# Patient Record
Sex: Female | Born: 1950 | Race: White | Hispanic: No | Marital: Married | Smoking: Never smoker
Health system: Southern US, Community
[De-identification: ages and names within clinical notes are randomized; demographics above are authoritative.]

## PROBLEM LIST (undated history)

## (undated) DIAGNOSIS — K219 Gastro-esophageal reflux disease without esophagitis: Secondary | ICD-10-CM

## (undated) DIAGNOSIS — E669 Obesity, unspecified: Secondary | ICD-10-CM

## (undated) DIAGNOSIS — Z8639 Personal history of other endocrine, nutritional and metabolic disease: Secondary | ICD-10-CM

## (undated) DIAGNOSIS — R32 Unspecified urinary incontinence: Secondary | ICD-10-CM

## (undated) DIAGNOSIS — I10 Essential (primary) hypertension: Secondary | ICD-10-CM

## (undated) DIAGNOSIS — E119 Type 2 diabetes mellitus without complications: Secondary | ICD-10-CM

## (undated) DIAGNOSIS — E785 Hyperlipidemia, unspecified: Secondary | ICD-10-CM

## (undated) DIAGNOSIS — R42 Dizziness and giddiness: Secondary | ICD-10-CM

## (undated) DIAGNOSIS — E039 Hypothyroidism, unspecified: Secondary | ICD-10-CM

## (undated) DIAGNOSIS — L918 Other hypertrophic disorders of the skin: Secondary | ICD-10-CM

## (undated) HISTORY — DX: Essential (primary) hypertension: I10

## (undated) HISTORY — DX: Hyperlipidemia, unspecified: E78.5

## (undated) HISTORY — DX: Obesity, unspecified: E66.9

## (undated) HISTORY — DX: Personal history of other endocrine, nutritional and metabolic disease: Z86.39

## (undated) HISTORY — DX: Unspecified urinary incontinence: R32

## (undated) HISTORY — DX: Hypothyroidism, unspecified: E03.9

## (undated) HISTORY — DX: Dizziness and giddiness: R42

## (undated) HISTORY — DX: Type 2 diabetes mellitus without complications: E11.9

## (undated) HISTORY — DX: Other hypertrophic disorders of the skin: L91.8

## (undated) HISTORY — DX: Gastro-esophageal reflux disease without esophagitis: K21.9

---
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
## 1993-10-21 HISTORY — PX: CHOLECYSTECTOMY: SHX55

## 2011-09-17 DIAGNOSIS — S5290XA Unspecified fracture of unspecified forearm, initial encounter for closed fracture: Secondary | ICD-10-CM | POA: Insufficient documentation

## 2015-04-06 DIAGNOSIS — K760 Fatty (change of) liver, not elsewhere classified: Secondary | ICD-10-CM | POA: Insufficient documentation

## 2016-05-27 DIAGNOSIS — G510 Bell's palsy: Secondary | ICD-10-CM | POA: Insufficient documentation

## 2017-12-06 DIAGNOSIS — E039 Hypothyroidism, unspecified: Secondary | ICD-10-CM | POA: Insufficient documentation

## 2017-12-06 DIAGNOSIS — E66811 Obesity, class 1: Secondary | ICD-10-CM | POA: Insufficient documentation

## 2017-12-06 DIAGNOSIS — Z6835 Body mass index (BMI) 35.0-35.9, adult: Secondary | ICD-10-CM | POA: Insufficient documentation

## 2017-12-06 DIAGNOSIS — I10 Essential (primary) hypertension: Secondary | ICD-10-CM | POA: Insufficient documentation

## 2018-01-01 DIAGNOSIS — M542 Cervicalgia: Secondary | ICD-10-CM | POA: Insufficient documentation

## 2018-01-01 DIAGNOSIS — R42 Dizziness and giddiness: Secondary | ICD-10-CM | POA: Insufficient documentation

## 2019-04-07 ENCOUNTER — Other Ambulatory Visit: Payer: Self-pay | Admitting: Internal Medicine

## 2019-04-07 ENCOUNTER — Ambulatory Visit
Admission: RE | Admit: 2019-04-07 | Discharge: 2019-04-07 | Disposition: A | Discharge status: Home / Self Care | Payer: Medicare Other | Source: Ambulatory Visit | Attending: Internal Medicine | Admitting: Internal Medicine

## 2019-04-07 DIAGNOSIS — M25522 Pain in left elbow: Secondary | ICD-10-CM

## 2019-10-27 ENCOUNTER — Encounter: Payer: Self-pay | Admitting: Pulmonary Disease

## 2019-10-27 ENCOUNTER — Ambulatory Visit (INDEPENDENT_AMBULATORY_CARE_PROVIDER_SITE_OTHER): Payer: Medicare Other | Admitting: Pulmonary Disease

## 2019-10-27 ENCOUNTER — Other Ambulatory Visit: Payer: Self-pay

## 2019-10-27 VITALS — BP 126/64 | HR 68 | Ht 62.0 in | Wt 174.8 lb

## 2019-10-27 DIAGNOSIS — G4733 Obstructive sleep apnea (adult) (pediatric): Secondary | ICD-10-CM

## 2019-10-27 NOTE — Patient Instructions (Addendum)
Obstructive sleep apnea  We will schedule you for a home sleep study  Set you up with CPAP-similar pressures with what you are on already  We will see you back in about 6 to 8 weeks  Call with significant concerns

## 2019-10-27 NOTE — Progress Notes (Signed)
Subjective:    Patient ID: Lori Mcdonald, female    DOB: 1951/10/17, 69 y.o.   MRN: [id number]  Patient with a history of obstructive sleep apnea  Current machine is stated  Has used CPAP for 17 years Usually goes to bed between 1 and 2 AM Does not wake up in the middle of the night Final awakening time between 830 and 9 AM continues to benefit from CPAP use  Denies any significant dryness of the mouth in the mornings No morning headaches Memory is good No choking or abnormal respirations at night  Continues to benefit from CPAP use   History reviewed. No pertinent past medical history.  Social History   Socioeconomic History  . Marital status: Married    Spouse name: Not on file  . Number of children: Not on file  . Years of education: Not on file  . Highest education level: Not on file  Occupational History  . Not on file  Tobacco Use  . Smoking status: Never Smoker  . Smokeless tobacco: Never Used  Substance and Sexual Activity  . Alcohol use: Never  . Drug use: Never  . Sexual activity: Not on file  Other Topics Concern  . Not on file  Social History Narrative  . Not on file   Social Determinants of Health   Financial Resource Strain:   . Difficulty of Paying Living Expenses: Not on file  Food Insecurity:   . Worried About Programme researcher, broadcasting/film/video in the Last Year: Not on file  . Ran Out of Food in the Last Year: Not on file  Transportation Needs:   . Lack of Transportation (Medical): Not on file  . Lack of Transportation (Non-Medical): Not on file  Physical Activity:   . Days of Exercise per Week: Not on file  . Minutes of Exercise per Session: Not on file  Stress:   . Feeling of Stress : Not on file  Social Connections:   . Frequency of Communication with Friends and Family: Not on file  . Frequency of Social Gatherings with Friends and Family: Not on file  . Attends Religious Services: Not on file  . Active Member of Clubs or Organizations: Not on  file  . Attends Banker Meetings: Not on file  . Marital Status: Not on file  Intimate Partner Violence:   . Fear of Current or Ex-Partner: Not on file  . Emotionally Abused: Not on file  . Physically Abused: Not on file  . Sexually Abused: Not on file   History reviewed. No pertinent family history.  Review of Systems  Constitutional: Negative for fever and unexpected weight change.  HENT: Negative for congestion, dental problem, ear pain, nosebleeds, postnasal drip, rhinorrhea, sinus pressure, sneezing, sore throat and trouble swallowing.   Eyes: Negative for redness and itching.  Respiratory: Negative for cough, chest tightness, shortness of breath and wheezing.   Cardiovascular: Negative for palpitations and leg swelling.  Gastrointestinal: Negative for nausea and vomiting.  Genitourinary: Negative for dysuria.  Musculoskeletal: Negative for joint swelling.  Skin: Negative for rash.  Allergic/Immunologic: Positive for environmental allergies. Negative for food allergies and immunocompromised state.  Neurological: Negative for headaches.  Hematological: Does not bruise/bleed easily.  Psychiatric/Behavioral: Negative for dysphoric mood. The patient is not nervous/anxious.   All other systems reviewed and are negative.     Objective:   Physical Exam Constitutional:      Appearance: She is obese.  HENT:     Head:  Normocephalic and atraumatic.     Nose: Nose normal. No congestion.     Mouth/Throat:     Mouth: Mucous membranes are moist.  Eyes:     Pupils: Pupils are equal, round, and reactive to light.  Cardiovascular:     Rate and Rhythm: Normal rate and regular rhythm.     Pulses: Normal pulses.     Heart sounds: Normal heart sounds. No murmur. No friction rub.  Pulmonary:     Effort: Pulmonary effort is normal. No respiratory distress.     Breath sounds: Normal breath sounds. No stridor. No wheezing or rhonchi.  Musculoskeletal:        General: No  swelling. Normal range of motion.     Cervical back: Normal range of motion and neck supple. No rigidity.  Skin:    General: Skin is warm and dry.     Coloration: Skin is not jaundiced or pale.     Findings: No bruising or erythema.  Neurological:     General: No focal deficit present.     Mental Status: She is alert.  Psychiatric:        Mood and Affect: Mood normal.    Vitals:   10/27/19 1425  BP: 126/64  Pulse: 68  SpO2: 97%   Results of the Epworth flowsheet 10/27/2019  Sitting and reading 0  Watching TV 0  Sitting, inactive in a public place (e.g. a theatre or a meeting) 0  As a passenger in a car for an hour without a break 0  Lying down to rest in the afternoon when circumstances permit 2  Sitting and talking to someone 0  Sitting quietly after a lunch without alcohol 0  In a car, while stopped for a few minutes in traffic 0  Total score 2   Compliance reveals 100% compliance Average pressure 7.8 Peak pressure of 8.6 Auto CPAP 4-20 Residual AHI 1.5     Assessment & Plan:  .  Obstructive sleep apnea .  Adequately treated with CPAP therapy .  Machine is dated  .  Continues to benefit from CPAP use  Pathophysiology of sleep disordered breathing discussed Options of treatment discussed  Plan: We will set the patient up for home sleep study We will initiate auto CPAP 4-20 following confirmation of diagnosis  I will see her back in the office in 6 to 8 weeks following which will follow yearly

## 2019-11-10 ENCOUNTER — Telehealth: Payer: Self-pay | Admitting: Pulmonary Disease

## 2019-11-18 ENCOUNTER — Ambulatory Visit: Payer: PRIVATE HEALTH INSURANCE

## 2019-11-26 ENCOUNTER — Ambulatory Visit: Payer: Medicare Other | Attending: Internal Medicine

## 2019-11-26 DIAGNOSIS — Z23 Encounter for immunization: Secondary | ICD-10-CM | POA: Insufficient documentation

## 2019-11-26 NOTE — Progress Notes (Signed)
   Covid-19 Vaccination Clinic  Name:  Lori Mcdonald    MRN: [id number] DOB: 09/19/1951  11/26/2019  Lori Mcdonald was observed post Covid-19 immunization for 15 minutes without incidence. She was provided with Vaccine Information Sheet and instruction to access the V-Safe system.   Lori Mcdonald was instructed to call 911 with any severe reactions post vaccine: Marland Kitchen Difficulty breathing  . Swelling of your face and throat  . A fast heartbeat  . A bad rash all over your body  . Dizziness and weakness    Immunizations Administered    Name Date Dose VIS Date Route   Pfizer COVID-19 Vaccine 11/26/2019  3:31 PM 0.3 mL 10/01/2019 Intramuscular   Manufacturer: [employer], [employer]   Lot: [id number]   NDC: [address]-1

## 2019-12-13 ENCOUNTER — Ambulatory Visit (INDEPENDENT_AMBULATORY_CARE_PROVIDER_SITE_OTHER): Payer: Medicare Other

## 2019-12-13 ENCOUNTER — Other Ambulatory Visit: Payer: Self-pay

## 2019-12-13 DIAGNOSIS — G4733 Obstructive sleep apnea (adult) (pediatric): Secondary | ICD-10-CM

## 2019-12-14 DIAGNOSIS — G4733 Obstructive sleep apnea (adult) (pediatric): Secondary | ICD-10-CM | POA: Diagnosis not present

## 2019-12-15 DIAGNOSIS — G4733 Obstructive sleep apnea (adult) (pediatric): Secondary | ICD-10-CM

## 2019-12-16 ENCOUNTER — Telehealth: Payer: Self-pay | Admitting: Pulmonary Disease

## 2019-12-16 NOTE — Telephone Encounter (Signed)
Called and spoke with Patient. Dr. Trena Platt results and recommendations given.  Patient stated she currently uses a cpap and is needing a new cpap.  Patient stated she can not sleep without a cpap, and after her sleep study, she had a headache that medications did not help. Patient stated she has to have a new cpap.   Dr. Wynona Neat has reviewed the home sleep test this test was negative for sleep apnea. No significant sleep disordered breathing. No significant desaturations.   Dr . Wynona Neat recommendation are encourage regular exercise, and weight loss. Caution against driving while sleepy and against medications with sedative effects. Please note a negative sleep study does not rule out diagnosis for osa.  If the pretest probability is high, up to 25-30%, may have a false negative study.   Message routed to Dr. Wynona Neat

## 2019-12-21 ENCOUNTER — Ambulatory Visit: Payer: Medicare Other | Attending: Internal Medicine

## 2019-12-21 DIAGNOSIS — Z23 Encounter for immunization: Secondary | ICD-10-CM

## 2019-12-21 NOTE — Progress Notes (Signed)
   Covid-19 Vaccination Clinic  Name:  Macall Mccroskey    MRN: [id number] DOB: 22-Jun-1951  12/21/2019  Ms. Goren was observed post Covid-19 immunization for 15 minutes without incident. She was provided with Vaccine Information Sheet and instruction to access the V-Safe system.   Ms. Stellmach was instructed to call 911 with any severe reactions post vaccine: Marland Kitchen Difficulty breathing  . Swelling of face and throat  . A fast heartbeat  . A bad rash all over body  . Dizziness and weakness   Immunizations Administered    Name Date Dose VIS Date Route   Pfizer COVID-19 Vaccine 12/21/2019  2:14 PM 0.3 mL 10/01/2019 Intramuscular   Manufacturer: [employer], [employer]   Lot: [id number]   NDC: [address]-8

## 2019-12-22 ENCOUNTER — Telehealth: Payer: Self-pay | Admitting: Pulmonary Disease

## 2019-12-22 NOTE — Telephone Encounter (Signed)
ATC Patient.  LM to call back. 

## 2019-12-22 NOTE — Telephone Encounter (Signed)
We can schedule an in lab study for the patient if she is agreeable.  Unfortunately, we cannot treat her based on a previous history or symptomatology. We have to have a positive study showing she still has obstructive sleep apnea.  Home sleep studies can underestimate the problem in some cases, there is a possibility that her sleep apnea is also better.  I will be glad to see her in the office if she is not agreeable to an in lab study. She also needs to understand that if an in lab study is negative, we cannot prescribe CPAP.

## 2019-12-22 NOTE — Telephone Encounter (Signed)
Called and spoke with pt to see if she was calling to get an appt scheduled to further discuss sleep study and pt stated that she did call and an appt was scheduled. nothing further needed.

## 2019-12-27 ENCOUNTER — Ambulatory Visit (INDEPENDENT_AMBULATORY_CARE_PROVIDER_SITE_OTHER): Payer: Medicare Other | Admitting: Pulmonary Disease

## 2019-12-27 ENCOUNTER — Encounter: Payer: Self-pay | Admitting: Pulmonary Disease

## 2019-12-27 ENCOUNTER — Other Ambulatory Visit: Payer: Self-pay

## 2019-12-27 DIAGNOSIS — G4733 Obstructive sleep apnea (adult) (pediatric): Secondary | ICD-10-CM | POA: Diagnosis not present

## 2019-12-27 DIAGNOSIS — Z9989 Dependence on other enabling machines and devices: Secondary | ICD-10-CM | POA: Diagnosis not present

## 2019-12-27 NOTE — Telephone Encounter (Signed)
Patient has Tele visit at 1200 12/27/19.

## 2019-12-27 NOTE — Progress Notes (Signed)
Virtual Visit via Telephone Note  I connected with Latanya Presser on 12/27/19 at 12:00 PM EST by telephone and verified that I am speaking with the correct person using two identifiers.  Location: Patient: Kimberly[facility] Dil[facility] Provider: Stephannie Peters discussed the limitations, risks, security and privacy concerns of performing an evaluation and management service by telephone and the availability of in person appointments. I also discussed with the patient that there may be a patient responsible charge related to this service. The patient expressed understanding and agreed to proceed.   History of Present Illness: Patient with a history of obstructive sleep apnea Recently had a home sleep study performed which was negative for significant sleep disordered breathing  Patient has significant symptoms at night and also significant daytime sleepiness Not able to sleep without CPAP  Snores heavily Multiple witnessed apneas by spouse  She does not think she will be able to sleep in the lab, did not have good experience on the previous sleep study   Observations/Objective: Frustrated about having to repeat study   Assessment and Plan: Obstructive sleep apnea Negative recent home sleep study  Plan will be to repeat sleep study in a couple of months She wants to repeat a home sleep study after giving it some time  She is aware that a negative study means we cannot treat her for sleep disordered breathing despite her symptoms A repeat negative home sleep study will indicate that she has to go into the lab to have a study done  Follow Up Instructions:    I discussed the assessment and treatment plan with the patient. The patient was provided an opportunity to ask questions and all were answered. The patient agreed with the plan and demonstrated an understanding of the instructions.   The patient was advised to call back or seek an in-person evaluation if the symptoms worsen or if the condition  fails to improve as anticipated.  I provided 20 minutes of non-face-to-face time during this encounter.   Tomma Lightning, MD

## 2020-01-03 ENCOUNTER — Encounter: Payer: Self-pay | Admitting: Family

## 2020-01-03 ENCOUNTER — Ambulatory Visit (INDEPENDENT_AMBULATORY_CARE_PROVIDER_SITE_OTHER): Payer: Medicare Other | Admitting: Family

## 2020-01-03 ENCOUNTER — Other Ambulatory Visit: Payer: Self-pay

## 2020-01-03 VITALS — BP 124/70 | HR 72 | Temp 98.0°F | Ht 62.0 in | Wt 174.0 lb

## 2020-01-03 DIAGNOSIS — E119 Type 2 diabetes mellitus without complications: Secondary | ICD-10-CM

## 2020-01-03 DIAGNOSIS — E785 Hyperlipidemia, unspecified: Secondary | ICD-10-CM | POA: Insufficient documentation

## 2020-01-03 DIAGNOSIS — E039 Hypothyroidism, unspecified: Secondary | ICD-10-CM | POA: Diagnosis not present

## 2020-01-03 DIAGNOSIS — E559 Vitamin D deficiency, unspecified: Secondary | ICD-10-CM | POA: Diagnosis not present

## 2020-01-03 DIAGNOSIS — Z1321 Encounter for screening for nutritional disorder: Secondary | ICD-10-CM | POA: Diagnosis not present

## 2020-01-03 DIAGNOSIS — E782 Mixed hyperlipidemia: Secondary | ICD-10-CM

## 2020-01-03 DIAGNOSIS — Z862 Personal history of diseases of the blood and blood-forming organs and certain disorders involving the immune mechanism: Secondary | ICD-10-CM

## 2020-01-03 DIAGNOSIS — I10 Essential (primary) hypertension: Secondary | ICD-10-CM

## 2020-01-03 LAB — COMPREHENSIVE METABOLIC PANEL
ALT: 13 U/L (ref 0–35)
AST: 11 U/L (ref 0–37)
Albumin: 4.1 g/dL (ref 3.5–5.2)
Alkaline Phosphatase: 58 U/L (ref 39–117)
BUN: 18 mg/dL (ref 6–23)
CO2: 28 mEq/L (ref 19–32)
Calcium: 9.4 mg/dL (ref 8.4–10.5)
Chloride: 103 mEq/L (ref 96–112)
Creatinine, Ser: 0.64 mg/dL (ref 0.40–1.20)
GFR: 92.05 mL/min (ref >60.00)
Glucose, Bld: 97 mg/dL (ref 70–99)
Potassium: 4.3 mEq/L (ref 3.5–5.1)
Sodium: 141 mEq/L (ref 135–145)
Total Bilirubin: 0.3 mg/dL (ref 0.2–1.2)
Total Protein: 7 g/dL (ref 6.0–8.3)

## 2020-01-03 LAB — LDL CHOLESTEROL, DIRECT: Direct LDL: 89 mg/dL

## 2020-01-03 LAB — CBC WITH DIFFERENTIAL/PLATELET
Basophils Absolute: 0.1 10*3/uL (ref 0.0–0.1)
Basophils Relative: 0.6 % (ref 0.0–3.0)
Eosinophils Absolute: 0.2 10*3/uL (ref 0.0–0.7)
Eosinophils Relative: 2.5 % (ref 0.0–5.0)
HCT: 36.9 % (ref 36.0–46.0)
Hemoglobin: 12.2 g/dL (ref 12.0–15.0)
Lymphocytes Relative: 20.2 % (ref 12.0–46.0)
Lymphs Abs: 2 10*3/uL (ref 0.7–4.0)
MCHC: 33.2 g/dL (ref 30.0–36.0)
MCV: 82.8 fl (ref 78.0–100.0)
Monocytes Absolute: 0.5 10*3/uL (ref 0.1–1.0)
Monocytes Relative: 5.5 % (ref 3.0–12.0)
Neutro Abs: 7 10*3/uL (ref 1.4–7.7)
Neutrophils Relative %: 71.2 % (ref 43.0–77.0)
Platelets: 302 10*3/uL (ref 150.0–400.0)
RBC: 4.45 Mil/uL (ref 3.87–5.11)
RDW: 13.8 % (ref 11.5–15.5)
WBC: 9.9 10*3/uL (ref 4.0–10.5)

## 2020-01-03 LAB — LIPID PANEL
Cholesterol: 163 mg/dL (ref 0–200)
HDL: 33.2 mg/dL — ABNORMAL LOW (ref >39.00)
NonHDL: 130.03
Total CHOL/HDL Ratio: 5
Triglycerides: 349 mg/dL — ABNORMAL HIGH (ref 0.0–149.0)
VLDL: 69.8 mg/dL — ABNORMAL HIGH (ref 0.0–40.0)

## 2020-01-03 LAB — TSH: TSH: 2.5 u[IU]/mL (ref 0.35–4.50)

## 2020-01-03 LAB — HEMOGLOBIN A1C: Hgb A1c MFr Bld: 5.9 % (ref 4.6–6.5)

## 2020-01-03 LAB — VITAMIN D 25 HYDROXY (VIT D DEFICIENCY, FRACTURES): VITD: 68.41 ng/mL (ref 30.00–100.00)

## 2020-01-03 MED ORDER — AMLODIPINE BESYLATE 2.5 MG PO TABS: 2.5000 mg | ORAL_TABLET | Freq: Every day | ORAL | 3 refills | Status: DC

## 2020-01-03 NOTE — Progress Notes (Signed)
Lori Mcdonald is a 69 y.o. female with the following history as recorded in Epic[facility]:  Patient Active Problem List   Diagnosis Date Noted  . Hyperlipidemia 01/03/2020  . Type 2 diabetes mellitus without complication, without long-term current use of insulin ([place]) 01/03/2020  . Essential hypertension 12/06/2017    Current Outpatient Medications  Medication Sig Dispense Refill  . acetaminophen (TYLENOL) 500 MG tablet Take by mouth.    Marland Kitchen amLODipine (NORVASC) 2.5 MG tablet Take 1 tablet (2.5 mg total) by mouth daily. 90 tablet 3  . ibuprofen (ADVIL) 400 MG tablet Take by mouth.    . metFORMIN (GLUCOPHAGE) 500 MG tablet Take 2,000 mg by mouth daily with breakfast.     . Multiple Vitamin (THERA) TABS Take by mouth.    . rosuvastatin (CRESTOR) 5 MG tablet Take 5 mg by mouth daily.     No current facility-administered medications for this visit.    Allergies: Ciprofloxacin and Demerol [meperidine]  History reviewed. No pertinent past medical history.  Past Surgical History:  Procedure Laterality Date  . CHOLECYSTECTOMY  1995  . TONSILLECTOMY  1957    History reviewed. No pertinent family history.  Social History   Tobacco Use  . Smoking status: Never Smoker  . Smokeless tobacco: Never Used  Substance Use Topics  . Alcohol use: Never    Subjective:  Patient presents today as a new patient;  1) History of hypertension; 2) History of hyperlipidemia; on low dose of Crestor 5 mg daily; 3) History of Type 2 Diabetes- last Hgba1c was checked almost a year ago due to COVID restrictions; thinks it was around 5.9? 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  Head: Normocephalic and atraumatic  Eyes: Sclera and conjunctiva clear; pupils round and reactive to light; extraocular movements intact  Ears: External normal; canals clear; tympanic membranes normal  Oropharynx: Pink, supple. No suspicious lesions  Neck: Supple without thyromegaly, adenopathy  Lungs: Respirations unlabored; clear to auscultation bilaterally without wheeze, rales, rhonchi  CVS exam: normal rate, regular rhythm, normal S1, S2, no murmurs, rubs, clicks or gallops.  Neurologic: Alert and oriented; speech intact; face symmetrical; moves all extremities well; CNII-XII intact without focal deficit   Assessment:  1. Type 2 diabetes mellitus without complication, without long-term current use of insulin ([place])   2. Hypothyroidism, unspecified type   3. Encounter for vitamin deficiency screening   4. Vitamin D deficiency   5. History of iron deficiency anemia   6. Essential hypertension   7. Mixed hyperlipidemia     Plan:  1. Update labs today; will adjust medication accordingly; 2. Sound like she was treated for subclinical hypothyroidism in the past- not currently taking medication; check TSh today; 4. Check Vitamin D level; 5. Check ferritin, iron, TIBC; 6. Stable; refill updated; 7. Check lipid panel- will decide if need to increase dosage of Crestor;  Follow-up to be determined;    No  follow-ups on file.  Orders Placed This Encounter  Procedures  . CBC with Differential/Platelet  . Comp Met (CMET)  . Lipid panel  . HgB A1c  . TSH  . Vitamin D (25 hydroxy)  . Iron, TIBC and Ferritin Panel    Requested Prescriptions   Signed Prescriptions Disp Refills  . amLODipine (NORVASC) 2.5 MG tablet 90 tablet 3    Sig: Take 1 tablet (2.5 mg total) by mouth daily.

## 2020-01-04 ENCOUNTER — Other Ambulatory Visit: Payer: Self-pay | Admitting: Family

## 2020-01-04 LAB — IRON,TIBC AND FERRITIN PANEL
%SAT: 13 % (calc) — ABNORMAL LOW (ref 16–45)
Ferritin: 55 ng/mL (ref 16–288)
Iron: 43 ug/dL — ABNORMAL LOW (ref 45–160)
TIBC: 335 mcg/dL (calc) (ref 250–450)

## 2020-01-04 MED ORDER — METFORMIN HCL 500 MG PO TABS: 2000.0000 mg | ORAL_TABLET | Freq: Every day | ORAL | 2 refills | Status: DC

## 2020-01-04 MED ORDER — ROSUVASTATIN CALCIUM 5 MG PO TABS: 5.0000 mg | ORAL_TABLET | Freq: Every day | ORAL | 1 refills | Status: DC

## 2020-02-16 IMAGING — DX LEFT ELBOW - 2 VIEW
2 series · 7 of 7 positions shown · non-contrast
Comparison: None.

CLINICAL DATA: Upper arm and posterior elbow pain since falling 5
days ago.

EXAM:
LEFT ELBOW - 2 VIEW

[Series 3: dg elbow 2 views left · left · 0.14mm/px · 3 of 3 slices shown (1 of 2)]
[im 1/3]
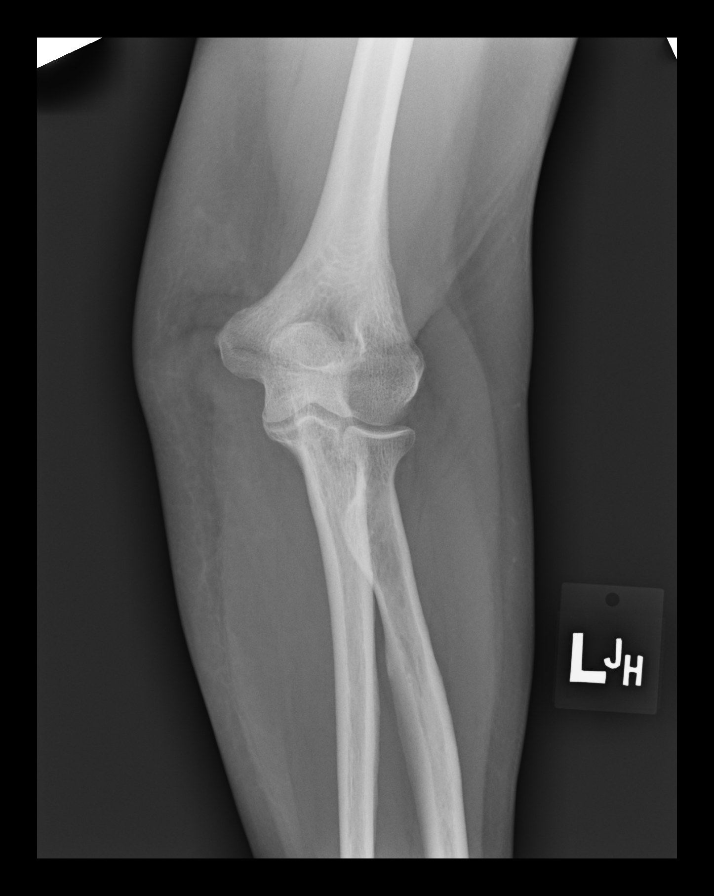
[im 2/3]
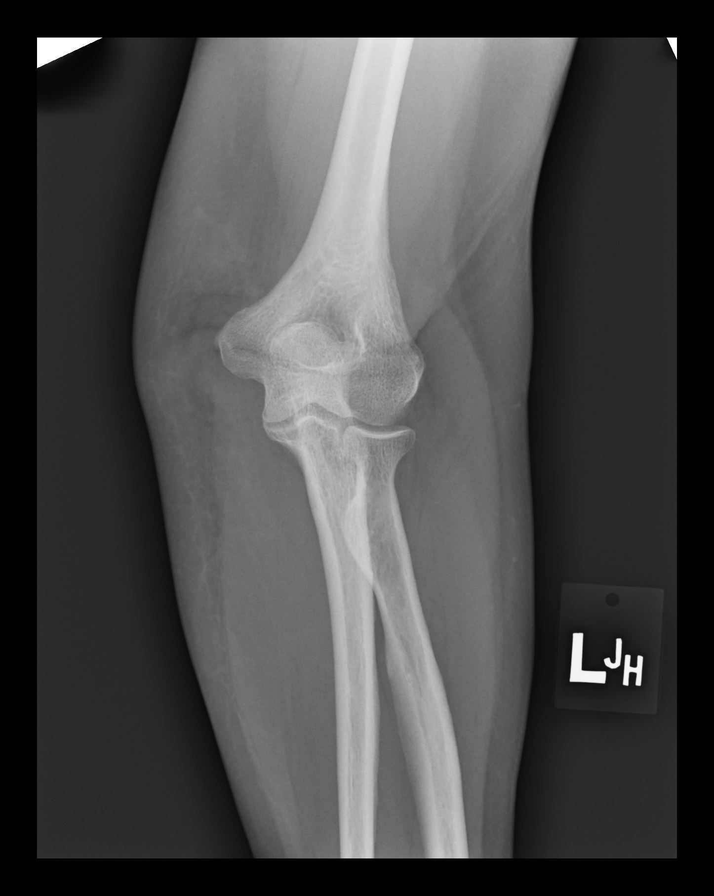
[im 3/3]
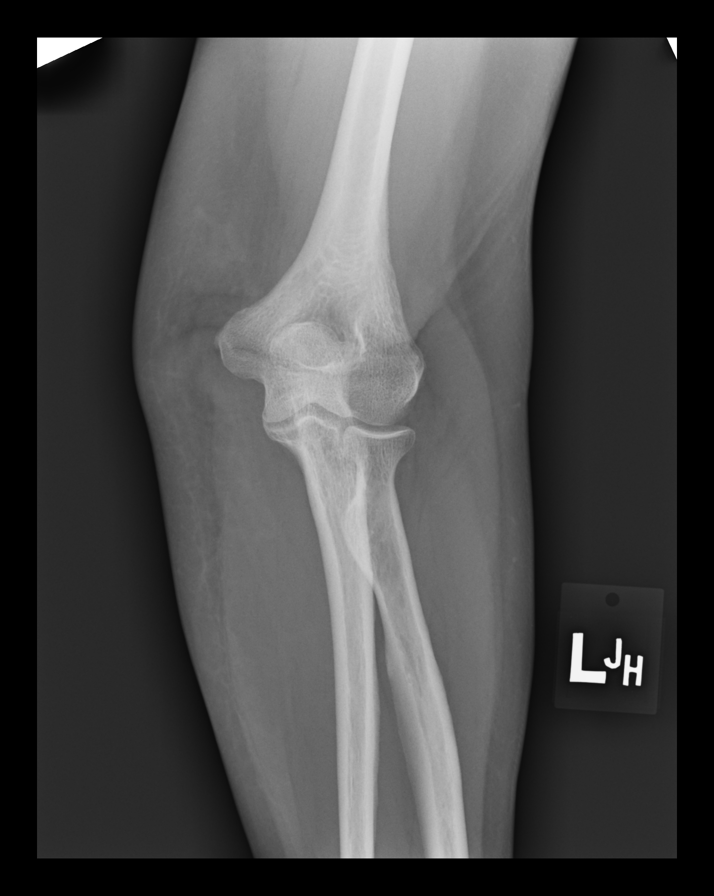

[Series 4: dg elbow 2 views left · left · 0.14mm/px · 4 of 4 slices shown (2 of 2)]
[im 1/4]
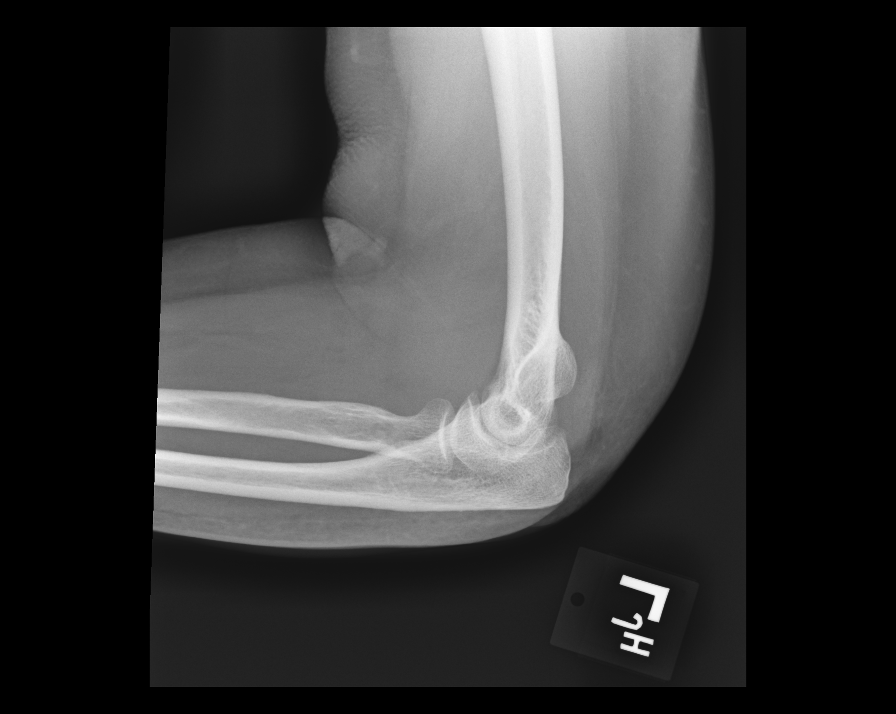
[im 2/4]
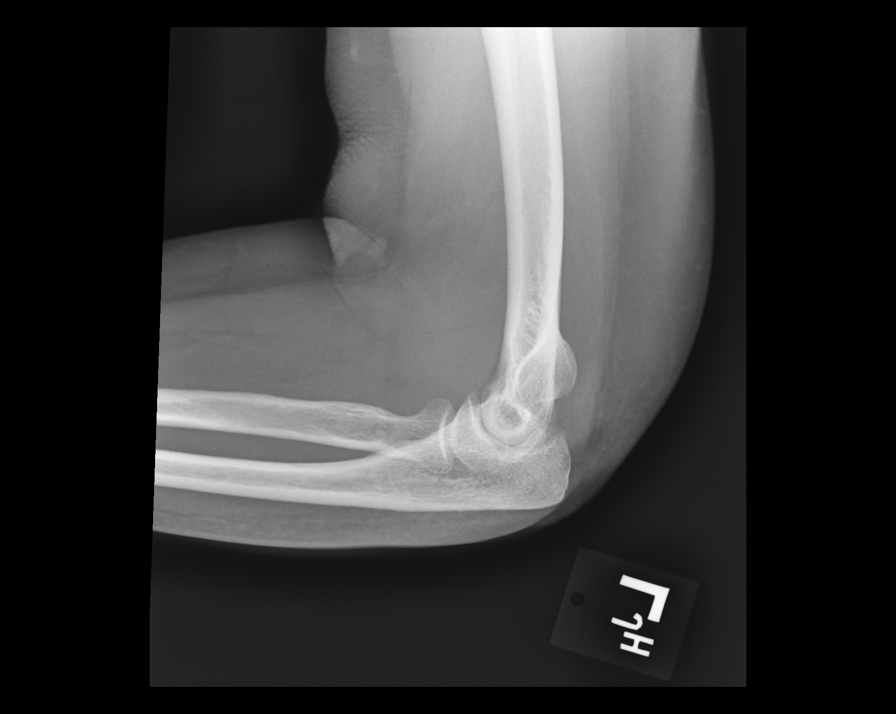
[im 3/4]
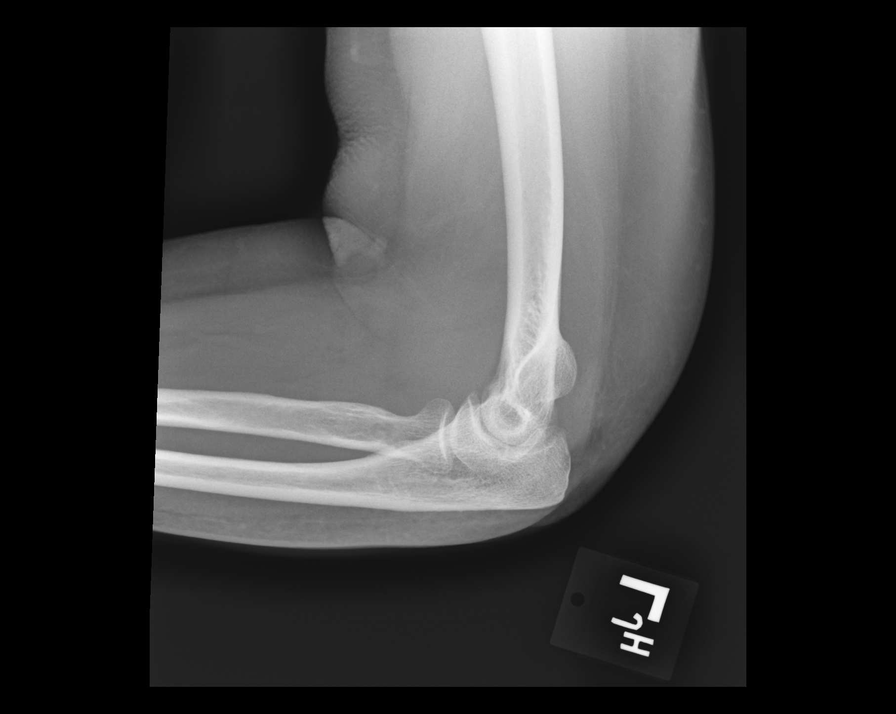
[im 4/4]
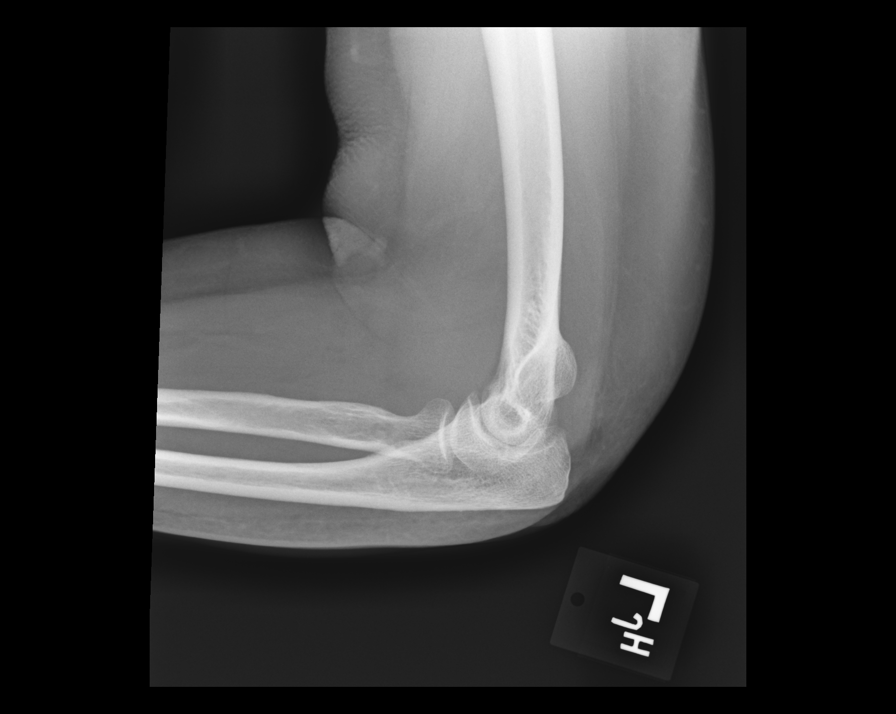

[7 of 7 positions shown; findings below may reference images not displayed]

FINDINGS: The mineralization and alignment are normal. There is no evidence of
acute fracture or dislocation. There is no elbow joint effusion.
Minimal spurring at the coronoid process. Possible mild soft tissue
swelling posteromedially without evidence of foreign body.
IMPRESSION: No acute osseous findings or significant joint effusion.

## 2020-05-04 ENCOUNTER — Encounter: Payer: Self-pay | Admitting: Family

## 2020-05-05 ENCOUNTER — Other Ambulatory Visit: Payer: Self-pay | Admitting: Family

## 2020-05-05 MED ORDER — COOL BLOOD GLUCOSE TEST STRIPS VI STRP: ORAL_STRIP | 3 refills | Status: DC

## 2020-05-06 ENCOUNTER — Encounter: Payer: Self-pay | Admitting: Family

## 2020-05-08 ENCOUNTER — Telehealth: Payer: Self-pay | Admitting: Family

## 2020-05-08 MED ORDER — CONTOUR NEXT TEST VI STRP: ORAL_STRIP | 3 refills | Status: DC

## 2020-05-08 MED ORDER — CONTOUR TEST VI STRP: ORAL_STRIP | 3 refills | Status: DC

## 2020-05-08 NOTE — Telephone Encounter (Signed)
New Message:    Tresa Endo from [facility] states they need a prescription for the pt's test strips for Contour next strips and the dx code needs to be sent with it. Please advise.

## 2020-05-08 NOTE — Telephone Encounter (Signed)
Resent rx for Contour NEXT ALONG W/DX CODE.Marland Kitchen/LMB

## 2020-05-09 ENCOUNTER — Encounter: Payer: Self-pay | Admitting: Internal Medicine

## 2020-05-09 ENCOUNTER — Ambulatory Visit (INDEPENDENT_AMBULATORY_CARE_PROVIDER_SITE_OTHER): Payer: Medicare Other | Admitting: Internal Medicine

## 2020-05-09 ENCOUNTER — Other Ambulatory Visit: Payer: Self-pay

## 2020-05-09 DIAGNOSIS — I10 Essential (primary) hypertension: Secondary | ICD-10-CM

## 2020-05-09 DIAGNOSIS — N3946 Mixed incontinence: Secondary | ICD-10-CM | POA: Insufficient documentation

## 2020-05-09 DIAGNOSIS — R300 Dysuria: Secondary | ICD-10-CM | POA: Diagnosis not present

## 2020-05-09 DIAGNOSIS — R32 Unspecified urinary incontinence: Secondary | ICD-10-CM | POA: Insufficient documentation

## 2020-05-09 DIAGNOSIS — G4730 Sleep apnea, unspecified: Secondary | ICD-10-CM | POA: Insufficient documentation

## 2020-05-09 DIAGNOSIS — R310 Gross hematuria: Secondary | ICD-10-CM | POA: Insufficient documentation

## 2020-05-09 DIAGNOSIS — E559 Vitamin D deficiency, unspecified: Secondary | ICD-10-CM | POA: Insufficient documentation

## 2020-05-09 MED ORDER — CEPHALEXIN 500 MG PO CAPS: 500.0000 mg | ORAL_CAPSULE | Freq: Three times a day (TID) | ORAL | 0 refills | Status: AC

## 2020-05-09 NOTE — Assessment & Plan Note (Addendum)
For UA, for empiric antibx,  to f/u any worsening symptoms or concerns  I spent 31 minutes in preparing to see the patient by review of recent labs, imaging and procedures, obtaining and reviewing separately obtained history, communicating with the patient and family or caregiver, ordering medications, tests or procedures, and documenting clinical information in the EHR including the differential Dx, treatment, and any further evaluation and other management of dysuria, hematuria, incontinence, htn

## 2020-05-09 NOTE — Assessment & Plan Note (Signed)
stable overall by history and exam, recent data reviewed with pt, and pt to continue medical treatment as before,  to f/u any worsening symptoms or concerns  

## 2020-05-09 NOTE — Progress Notes (Signed)
Subjective:    Patient ID: Lori Mcdonald, female    DOB: 07-24-1951, 69 y.o.   MRN: [id number]  HPI  Here with c/o acute dysuria and frequency and gross hematuria on top of chronic incontinence wearing depends daily, last uti x 2 yrs, Denies urinary symptoms such as urgency, flank pain, n/v, fever, chills. Pt denies chest pain, increased sob or doe, wheezing, orthopnea, PND, increased LE swelling, palpitations, dizziness or syncope.  Pt denies new neurological symptoms such as new headache, or facial or extremity weakness or numbness   Pt denies polydipsia, polyuria History reviewed. No pertinent past medical history. Past Surgical History:  Procedure Laterality Date  . CHOLECYSTECTOMY  1995  . TONSILLECTOMY  1957    reports that she has never smoked. She has never used smokeless tobacco. She reports that she does not drink alcohol and does not use drugs. family history is not on file. Allergies  Allergen Reactions  . Lisinopril Nausea And Vomiting    Gross hematuria  . Other Nausea And Vomiting    Severe drowsiness  . Triamterene Nausea And Vomiting    Blood in urine  . Anesthetics, Amide Nausea And Vomiting    Even nova cain   . Ciprofloxacin     Muscle pain  . Demerol [Meperidine]     Sedation    Current Outpatient Medications on File Prior to Visit  Medication Sig Dispense Refill  . acetaminophen (TYLENOL) 500 MG tablet Take by mouth.    Marland Kitchen amLODipine (NORVASC) 2.5 MG tablet Take 1 tablet (2.5 mg total) by mouth daily. 90 tablet 3  . Cholecalciferol 25 MCG (1000 UT) tablet Take 1 tablet by mouth daily.    Marland Kitchen glucose blood (CONTOUR NEXT TEST) test strip Use TO CHECK BLOOD SUGARS DAILY 100 each 3  . ibuprofen (ADVIL) 400 MG tablet Take by mouth.    . metFORMIN (GLUCOPHAGE) 500 MG tablet Take 4 tablets (2,000 mg total) by mouth daily with breakfast. 360 tablet 2  . Multiple Vitamin (THERA) TABS Take by mouth.    . rosuvastatin (CRESTOR) 5 MG tablet Take 1 tablet (5 mg total) by  mouth daily. 90 tablet 1   No current facility-administered medications on file prior to visit.   Review of Systems All otherwise neg per pt     Objective:   Physical Exam BP 122/80 (BP Location: Left Arm, Patient Position: Sitting, Cuff Size: Large)   Pulse 60   Temp 99.3 F (37.4 C) (Oral)   Ht 5\' 2"  (1.575 m)   Wt 173 lb (78.5 kg)   SpO2 98%   BMI 31.64 kg/m  VS noted,  Constitutional: Pt appears in NAD HENT: Head: NCAT.  Right Ear: External ear normal.  Left Ear: External ear normal.  Eyes: . Pupils are equal, round, and reactive to light. Conjunctivae and EOM are normal Nose: without d/c or deformity Neck: Neck supple. Gross normal ROM Cardiovascular: Normal rate and regular rhythm.   Pulmonary/Chest: Effort normal and breath sounds without rales or wheezing.  Abd:  Soft, mild low abd tender, ND, + BS, no organomegaly Neurological: Pt is alert. At baseline orientation, motor grossly intact Skin: Skin is warm. No rashes, other new lesions, no LE edema Psychiatric: Pt behavior is normal without agitation ] All otherwise neg per pt Lab Results  Component Value Date   WBC 9.9 01/03/2020   HGB 12.2 01/03/2020   HCT 36.9 01/03/2020   PLT 302.0 01/03/2020   GLUCOSE 97 01/03/2020   CHOL  163 01/03/2020   TRIG 349.0 (H) 01/03/2020   HDL 33.20 (L) 01/03/2020   LDLDIRECT 89.0 01/03/2020   ALT 13 01/03/2020   AST 11 01/03/2020   NA 141 01/03/2020   K 4.3 01/03/2020   CL 103 01/03/2020   CREATININE 0.64 01/03/2020   BUN 18 01/03/2020   CO2 28 01/03/2020   TSH 2.50 01/03/2020   HGBA1C 5.9 01/03/2020      Assessment & Plan:

## 2020-05-09 NOTE — Assessment & Plan Note (Signed)
likley uti, also for culture,  to f/u any worsening symptoms or concerns

## 2020-05-09 NOTE — Assessment & Plan Note (Signed)
Chronic persistent, declines urology referral

## 2020-05-09 NOTE — Patient Instructions (Addendum)
Please take all new medication as prescribed - the antibiotic  Please continue all other medications as before, and refills have been done if requested.  Please have the pharmacy call with any other refills you may need.  Please keep your appointments with your specialists as you may have planned  Please go to the LAB at the blood drawing area for the tests to be done - just the urine testing today  You will be contacted by phone if any changes need to be made immediately.  Otherwise, you will receive a letter about your results with an explanation, but please check with MyChart first.  Please remember to sign up for MyChart if you have not done so, as this will be important to you in the future with finding out test results, communicating by private email, and scheduling acute appointments online when needed.   

## 2020-05-10 LAB — URINE CULTURE

## 2020-05-10 LAB — URINALYSIS, ROUTINE W REFLEX MICROSCOPIC
Bilirubin Urine: NEGATIVE
Glucose, UA: NEGATIVE
Hyaline Cast: NONE SEEN /LPF
Ketones, ur: NEGATIVE
Nitrite: NEGATIVE
RBC / HPF: >=60 /HPF — AB (ref 0–2)
Specific Gravity, Urine: 1.024 (ref 1.001–1.03)
WBC, UA: >=60 /HPF — AB (ref 0–5)
pH: <=5 (ref 5.0–8.0)

## 2020-07-09 ENCOUNTER — Encounter: Payer: Self-pay | Admitting: Family

## 2020-07-10 ENCOUNTER — Other Ambulatory Visit: Payer: Self-pay | Admitting: Family

## 2020-07-10 ENCOUNTER — Other Ambulatory Visit: Payer: Medicare Other

## 2020-07-10 DIAGNOSIS — R300 Dysuria: Secondary | ICD-10-CM

## 2020-07-11 ENCOUNTER — Other Ambulatory Visit: Payer: Self-pay | Admitting: Family

## 2020-07-11 ENCOUNTER — Encounter: Payer: Self-pay | Admitting: Family

## 2020-07-11 LAB — URINALYSIS
Bilirubin Urine: NEGATIVE
Glucose, UA: NEGATIVE
Ketones, ur: NEGATIVE
Nitrite: NEGATIVE
Specific Gravity, Urine: 1.012 (ref 1.001–1.03)
pH: 6 (ref 5.0–8.0)

## 2020-07-11 LAB — URINE CULTURE
MICRO NUMBER:: 10970683
Result:: NO GROWTH
SPECIMEN QUALITY:: ADEQUATE

## 2020-07-11 MED ORDER — SULFAMETHOXAZOLE-TRIMETHOPRIM 800-160 MG PO TABS: 1.0000 | ORAL_TABLET | Freq: Two times a day (BID) | ORAL | 0 refills | Status: DC

## 2020-07-13 LAB — HM DIABETES EYE EXAM

## 2020-08-12 ENCOUNTER — Other Ambulatory Visit: Payer: Self-pay | Admitting: Family

## 2020-08-31 ENCOUNTER — Encounter: Payer: Self-pay | Admitting: Family

## 2020-08-31 NOTE — Telephone Encounter (Signed)
Please advise 

## 2020-09-04 ENCOUNTER — Encounter: Payer: Self-pay | Admitting: Family

## 2020-09-05 ENCOUNTER — Encounter: Payer: Self-pay | Admitting: Family

## 2020-09-05 ENCOUNTER — Other Ambulatory Visit: Payer: Self-pay | Admitting: Family

## 2020-09-05 MED ORDER — DAPAGLIFLOZIN PROPANEDIOL 5 MG PO TABS: 5.0000 mg | ORAL_TABLET | Freq: Every day | ORAL | 0 refills | Status: DC

## 2020-09-05 NOTE — Telephone Encounter (Signed)
Please advise 

## 2020-09-18 ENCOUNTER — Other Ambulatory Visit: Payer: Self-pay | Admitting: Family

## 2020-09-19 ENCOUNTER — Encounter: Payer: Self-pay | Admitting: Family

## 2020-09-19 ENCOUNTER — Other Ambulatory Visit: Payer: Self-pay | Admitting: Family

## 2020-09-19 MED ORDER — METFORMIN HCL 500 MG PO TABS: 1000.0000 mg | ORAL_TABLET | Freq: Two times a day (BID) | ORAL | 1 refills | Status: DC

## 2020-11-29 ENCOUNTER — Encounter: Payer: Self-pay | Admitting: Family

## 2020-11-29 ENCOUNTER — Other Ambulatory Visit: Payer: Self-pay | Admitting: Family

## 2020-11-29 MED ORDER — METFORMIN HCL 500 MG PO TABS: ORAL_TABLET | ORAL | 1 refills | Status: DC

## 2021-01-01 ENCOUNTER — Other Ambulatory Visit: Payer: Self-pay | Admitting: Family

## 2021-01-04 ENCOUNTER — Other Ambulatory Visit: Payer: Self-pay | Admitting: Family

## 2021-01-26 ENCOUNTER — Other Ambulatory Visit: Payer: Self-pay

## 2021-01-29 ENCOUNTER — Encounter: Payer: Self-pay | Admitting: Internal Medicine

## 2021-01-29 ENCOUNTER — Ambulatory Visit (INDEPENDENT_AMBULATORY_CARE_PROVIDER_SITE_OTHER): Payer: Medicare Other | Admitting: Internal Medicine

## 2021-01-29 ENCOUNTER — Other Ambulatory Visit: Payer: Self-pay

## 2021-01-29 VITALS — BP 142/78 | HR 76 | Temp 98.4°F | Resp 16 | Ht 62.0 in | Wt 169.0 lb

## 2021-01-29 DIAGNOSIS — E785 Hyperlipidemia, unspecified: Secondary | ICD-10-CM | POA: Diagnosis not present

## 2021-01-29 DIAGNOSIS — E119 Type 2 diabetes mellitus without complications: Secondary | ICD-10-CM | POA: Diagnosis not present

## 2021-01-29 DIAGNOSIS — Z23 Encounter for immunization: Secondary | ICD-10-CM | POA: Diagnosis not present

## 2021-01-29 DIAGNOSIS — K219 Gastro-esophageal reflux disease without esophagitis: Secondary | ICD-10-CM

## 2021-01-29 DIAGNOSIS — E781 Pure hyperglyceridemia: Secondary | ICD-10-CM

## 2021-01-29 DIAGNOSIS — I10 Essential (primary) hypertension: Secondary | ICD-10-CM | POA: Diagnosis not present

## 2021-01-29 DIAGNOSIS — Z1231 Encounter for screening mammogram for malignant neoplasm of breast: Secondary | ICD-10-CM | POA: Insufficient documentation

## 2021-01-29 LAB — CBC WITH DIFFERENTIAL/PLATELET
Basophils Absolute: 0.1 10*3/uL (ref 0.0–0.1)
Basophils Relative: 1.1 % (ref 0.0–3.0)
Eosinophils Absolute: 0.3 10*3/uL (ref 0.0–0.7)
Eosinophils Relative: 3.8 % (ref 0.0–5.0)
HCT: 36.3 % (ref 36.0–46.0)
Hemoglobin: 12.1 g/dL (ref 12.0–15.0)
Lymphocytes Relative: 26.6 % (ref 12.0–46.0)
Lymphs Abs: 2 10*3/uL (ref 0.7–4.0)
MCHC: 33.3 g/dL (ref 30.0–36.0)
MCV: 82 fl (ref 78.0–100.0)
Monocytes Absolute: 0.6 10*3/uL (ref 0.1–1.0)
Monocytes Relative: 7.7 % (ref 3.0–12.0)
Neutro Abs: 4.6 10*3/uL (ref 1.4–7.7)
Neutrophils Relative %: 60.8 % (ref 43.0–77.0)
Platelets: 293 10*3/uL (ref 150.0–400.0)
RBC: 4.42 Mil/uL (ref 3.87–5.11)
RDW: 14.9 % (ref 11.5–15.5)
WBC: 7.6 10*3/uL (ref 4.0–10.5)

## 2021-01-29 LAB — HEMOGLOBIN A1C: Hgb A1c MFr Bld: 6.2 % (ref 4.6–6.5)

## 2021-01-29 LAB — URINALYSIS, ROUTINE W REFLEX MICROSCOPIC
Bilirubin Urine: NEGATIVE
Hgb urine dipstick: NEGATIVE
Leukocytes,Ua: NEGATIVE
Nitrite: NEGATIVE
Specific Gravity, Urine: 1.025 (ref 1.000–1.030)
Urine Glucose: NEGATIVE
Urobilinogen, UA: 0.2 (ref 0.0–1.0)
pH: 5.5 (ref 5.0–8.0)

## 2021-01-29 LAB — MICROALBUMIN / CREATININE URINE RATIO
Creatinine,U: 198.4 mg/dL
Microalb Creat Ratio: 2.9 mg/g (ref 0.0–30.0)
Microalb, Ur: 5.7 mg/dL — ABNORMAL HIGH (ref 0.0–1.9)

## 2021-01-29 LAB — BASIC METABOLIC PANEL
BUN: 14 mg/dL (ref 6–23)
CO2: 32 mEq/L (ref 19–32)
Calcium: 9.6 mg/dL (ref 8.4–10.5)
Chloride: 104 mEq/L (ref 96–112)
Creatinine, Ser: 0.75 mg/dL (ref 0.40–1.20)
GFR: 80.96 mL/min (ref >60.00)
Glucose, Bld: 106 mg/dL — ABNORMAL HIGH (ref 70–99)
Potassium: 4.5 mEq/L (ref 3.5–5.1)
Sodium: 142 mEq/L (ref 135–145)

## 2021-01-29 LAB — HEPATIC FUNCTION PANEL
ALT: 23 U/L (ref 0–35)
AST: 19 U/L (ref 0–37)
Albumin: 3.9 g/dL (ref 3.5–5.2)
Alkaline Phosphatase: 53 U/L (ref 39–117)
Bilirubin, Direct: 0 mg/dL (ref 0.0–0.3)
Total Bilirubin: 0.3 mg/dL (ref 0.2–1.2)
Total Protein: 6.6 g/dL (ref 6.0–8.3)

## 2021-01-29 LAB — LDL CHOLESTEROL, DIRECT: Direct LDL: 87 mg/dL

## 2021-01-29 LAB — TSH: TSH: 2.89 u[IU]/mL (ref 0.35–4.50)

## 2021-01-29 LAB — LIPID PANEL
Cholesterol: 165 mg/dL (ref 0–200)
HDL: 34.8 mg/dL — ABNORMAL LOW (ref >39.00)
Total CHOL/HDL Ratio: 5
Triglycerides: 471 mg/dL — ABNORMAL HIGH (ref 0.0–149.0)

## 2021-01-29 MED ORDER — OLMESARTAN MEDOXOMIL 20 MG PO TABS: 20.0000 mg | ORAL_TABLET | Freq: Every day | ORAL | 0 refills | Status: DC

## 2021-01-29 MED ORDER — ESOMEPRAZOLE MAGNESIUM 20 MG PO CPDR: 20.0000 mg | DELAYED_RELEASE_CAPSULE | Freq: Every day | ORAL | 1 refills | Status: DC

## 2021-01-29 NOTE — Patient Instructions (Signed)

## 2021-01-29 NOTE — Progress Notes (Signed)
Subjective:  Patient ID: Lori Mcdonald, female    DOB: 1951-01-25  Age: 70 y.o. MRN: [id number]  CC: Hypertension, Hyperlipidemia, and Diabetes  This visit occurred during the SARS-CoV-2 public health emergency.  Safety protocols were in place, including screening questions prior to the visit, additional usage of staff PPE, and extensive cleaning of exam room while observing appropriate contact time as indicated for disinfecting solutions.    HPI Lori Mcdonald presents for establishing.  She complains of heartburn.  She is not getting much symptom relief with oral antacids.  She denies odynophagia, dysphagia, loss of appetite, or weight loss.  She is active and denies any recent episodes of chest pain, shortness of breath, diaphoresis, palpitation, edema, or fatigue.  She tells me her blood sugars have been well controlled.  She denies polys.  Outpatient Medications Prior to Visit  Medication Sig Dispense Refill  . acetaminophen (TYLENOL) 500 MG tablet Take by mouth.    Marland Kitchen glucose blood (CONTOUR NEXT TEST) test strip Use TO CHECK BLOOD SUGARS DAILY 100 each 3  . ibuprofen (ADVIL) 400 MG tablet Take by mouth.    . metFORMIN (GLUCOPHAGE) 500 MG tablet Take 2 tablets in the am and 2 tablets in the pm as directed 360 tablet 1  . Multiple Vitamin (THERA) TABS Take by mouth.    . rosuvastatin (CRESTOR) 5 MG tablet TAKE 1 TABLET DAILY 90 tablet 1  . amLODipine (NORVASC) 2.5 MG tablet TAKE 1 TABLET DAILY 90 tablet 0  . Cholecalciferol 25 MCG (1000 UT) tablet Take 1 tablet by mouth daily.    Marland Kitchen sulfamethoxazole-trimethoprim (BACTRIM DS) 800-160 MG tablet Take 1 tablet by mouth 2 (two) times daily. 10 tablet 0   No facility-administered medications prior to visit.    ROS Review of Systems  Constitutional: Negative for appetite change, chills, diaphoresis, fatigue and fever.  HENT: Negative.  Negative for trouble swallowing and voice change.   Eyes: Negative.   Respiratory: Negative for cough,  chest tightness, shortness of breath and wheezing.   Cardiovascular: Negative for chest pain, palpitations and leg swelling.  Gastrointestinal: Negative for abdominal pain, constipation, diarrhea and vomiting.  Endocrine: Negative.   Genitourinary: Negative.  Negative for difficulty urinating.  Musculoskeletal: Negative for arthralgias and myalgias.  Skin: Negative.  Negative for color change and pallor.  Neurological: Negative.  Negative for dizziness, weakness and light-headedness.  Hematological: Negative for adenopathy. Does not bruise/bleed easily.  Psychiatric/Behavioral: Negative.     Objective:  BP (!) 142/78 (BP Location: Left Arm, Patient Position: Sitting, Cuff Size: Large)   Pulse 76   Temp 98.4 F (36.9 C) (Oral)   Resp 16   Ht 5\' 2"  (1.575 m)   Wt 169 lb (76.7 kg)   SpO2 98%   BMI 30.91 kg/m   BP Readings from Last 3 Encounters:  01/29/21 (!) 142/78  05/09/20 122/80  01/03/20 124/70    Wt Readings from Last 3 Encounters:  01/29/21 169 lb (76.7 kg)  05/09/20 173 lb (78.5 kg)  01/03/20 174 lb (78.9 kg)    Physical Exam Vitals reviewed.  HENT:     Nose: Nose normal.     Mouth/Throat:     Mouth: Mucous membranes are moist.  Eyes:     General: No scleral icterus.    Conjunctiva/sclera: Conjunctivae normal.  Cardiovascular:     Rate and Rhythm: Normal rate and regular rhythm.     Heart sounds: No murmur heard.   Pulmonary:     Effort: Pulmonary  effort is normal.     Breath sounds: No stridor. No wheezing, rhonchi or rales.  Abdominal:     General: Abdomen is protuberant. Bowel sounds are normal. There is no distension.     Palpations: Abdomen is soft. There is no hepatomegaly, splenomegaly or mass.     Tenderness: There is no abdominal tenderness.  Musculoskeletal:        General: Normal range of motion.     Cervical back: Neck supple.  Lymphadenopathy:     Cervical: No cervical adenopathy.  Skin:    General: Skin is warm.  Neurological:      General: No focal deficit present.     Mental Status: She is alert.  Psychiatric:        Mood and Affect: Mood normal.        Behavior: Behavior normal.     Lab Results  Component Value Date   WBC 7.6 01/29/2021   HGB 12.1 01/29/2021   HCT 36.3 01/29/2021   PLT 293.0 01/29/2021   GLUCOSE 106 (H) 01/29/2021   CHOL 165 01/29/2021   TRIG (H) 01/29/2021    471.0 Triglyceride is over 400; calculations on Lipids are invalid.   HDL 34.80 (L) 01/29/2021   LDLDIRECT 87.0 01/29/2021   ALT 23 01/29/2021   AST 19 01/29/2021   NA 142 01/29/2021   K 4.5 01/29/2021   CL 104 01/29/2021   CREATININE 0.75 01/29/2021   BUN 14 01/29/2021   CO2 32 01/29/2021   TSH 2.89 01/29/2021   HGBA1C 6.2 01/29/2021   MICROALBUR 5.7 (H) 01/29/2021    DG Elbow 2 Views Left  Result Date: 04/07/2019 CLINICAL DATA:  Upper arm and posterior elbow pain since falling 5 days ago. EXAM: LEFT ELBOW - 2 VIEW COMPARISON:  None. FINDINGS: The mineralization and alignment are normal. There is no evidence of acute fracture or dislocation. There is no elbow joint effusion. Minimal spurring at the coronoid process. Possible mild soft tissue swelling posteromedially without evidence of foreign body. IMPRESSION: No acute osseous findings or significant joint effusion. Electronically Signed   By: Carey Bullocks M.D.   On: 04/07/2019 16:48   DG Humerus Left  Result Date: 04/07/2019 CLINICAL DATA:  Upper arm and posterior elbow pain since falling 5 days ago. EXAM: LEFT HUMERUS - 2+ VIEW COMPARISON:  None. FINDINGS: The bones appear adequately mineralized. No evidence of acute fracture or dislocation. Mild acromioclavicular degenerative changes are present. No focal soft tissue abnormalities are seen. IMPRESSION: No evidence of acute fracture or dislocation. Electronically Signed   By: Carey Bullocks M.D.   On: 04/07/2019 16:47    Assessment & Plan:   Milli was seen today for hypertension, hyperlipidemia and  diabetes.  Diagnoses and all orders for this visit:  Essential hypertension- Her blood pressure is not quite adequately well controlled.  Will continue the ARB and I have asked her to improve her lifestyle modifications. -     CBC with Differential/Platelet; Future -     Basic metabolic panel; Future -     TSH; Future -     Urinalysis, Routine w reflex microscopic; Future -     olmesartan (BENICAR) 20 MG tablet; Take 1 tablet (20 mg total) by mouth daily. -     Urinalysis, Routine w reflex microscopic -     TSH -     Basic metabolic panel -     CBC with Differential/Platelet  Pure hypertriglyceridemia- This is a nonfasting level.  Is not high  enough to be treated with a medication.  I encouraged to improve her diet and lifestyle modifications. -     Lipid panel; Future -     Lipid panel  Hyperlipidemia with target LDL less than 130- She has achieved her LDL goal is doing well on the statin. -     Lipid panel; Future -     TSH; Future -     Hepatic function panel; Future -     Hepatic function panel -     TSH -     Lipid panel  Type 2 diabetes mellitus without complication, without long-term current use of insulin (HCC)- Her blood sugar is adequately well controlled. -     HM Diabetes Foot Exam -     Basic metabolic panel; Future -     Microalbumin / creatinine urine ratio; Future -     Hepatic function panel; Future -     Hemoglobin A1c; Future -     olmesartan (BENICAR) 20 MG tablet; Take 1 tablet (20 mg total) by mouth daily. -     Hemoglobin A1c -     Hepatic function panel -     Microalbumin / creatinine urine ratio -     Basic metabolic panel  Visit for screening mammogram -     MM DIGITAL SCREENING BILATERAL; Future  Gastroesophageal reflux disease without esophagitis- Will treat with a PPI. -     esomeprazole (NEXIUM) 20 MG capsule; Take 1 capsule (20 mg total) by mouth daily at 12 noon.  Need for Tdap vaccination -     Tdap vaccine greater than or equal to 7yo  IM  Need for pneumococcal vaccination -     Pneumococcal conjugate vaccine 20-valent (Prevnar 20)  Other orders -     LDL cholesterol, direct   I have discontinued Lillias Willeford's Cholecalciferol, sulfamethoxazole-trimethoprim, and amLODipine. I am also having her start on olmesartan and esomeprazole. Additionally, I am having her maintain her Thera, ibuprofen, acetaminophen, Contour Next Test, metFORMIN, and rosuvastatin.  Meds ordered this encounter  Medications  . olmesartan (BENICAR) 20 MG tablet    Sig: Take 1 tablet (20 mg total) by mouth daily.    Dispense:  90 tablet    Refill:  0  . esomeprazole (NEXIUM) 20 MG capsule    Sig: Take 1 capsule (20 mg total) by mouth daily at 12 noon.    Dispense:  90 capsule    Refill:  1     Follow-up: Return in about 3 months (around 04/30/2021).  Sanda Linger, MD

## 2021-03-28 ENCOUNTER — Other Ambulatory Visit: Payer: Self-pay | Admitting: Internal Medicine

## 2021-03-28 DIAGNOSIS — E119 Type 2 diabetes mellitus without complications: Secondary | ICD-10-CM

## 2021-03-28 DIAGNOSIS — I10 Essential (primary) hypertension: Secondary | ICD-10-CM

## 2021-03-28 MED ORDER — OLMESARTAN MEDOXOMIL 20 MG PO TABS: 20.0000 mg | ORAL_TABLET | Freq: Every day | ORAL | 0 refills | Status: DC

## 2021-04-29 ENCOUNTER — Other Ambulatory Visit: Payer: Self-pay | Admitting: Internal Medicine

## 2021-04-29 DIAGNOSIS — I10 Essential (primary) hypertension: Secondary | ICD-10-CM

## 2021-04-29 DIAGNOSIS — E119 Type 2 diabetes mellitus without complications: Secondary | ICD-10-CM

## 2021-06-19 ENCOUNTER — Encounter: Payer: Self-pay | Admitting: Internal Medicine

## 2021-06-20 ENCOUNTER — Other Ambulatory Visit: Payer: Self-pay | Admitting: Internal Medicine

## 2021-06-20 DIAGNOSIS — E119 Type 2 diabetes mellitus without complications: Secondary | ICD-10-CM

## 2021-06-20 MED ORDER — CONTOUR NEXT TEST VI STRP
1.0000 | ORAL_STRIP | Freq: Two times a day (BID) | 3 refills | Status: DC
  Filled 2022-01-09: qty 100, 50d supply, fill #0

## 2021-08-03 ENCOUNTER — Other Ambulatory Visit: Payer: Self-pay | Admitting: Family

## 2021-08-18 ENCOUNTER — Encounter: Payer: Self-pay | Admitting: Internal Medicine

## 2021-08-21 LAB — HM DIABETES EYE EXAM

## 2021-08-29 ENCOUNTER — Other Ambulatory Visit: Payer: Self-pay

## 2021-08-29 ENCOUNTER — Ambulatory Visit (INDEPENDENT_AMBULATORY_CARE_PROVIDER_SITE_OTHER): Payer: Medicare Other | Admitting: Internal Medicine

## 2021-08-29 ENCOUNTER — Encounter: Payer: Self-pay | Admitting: Internal Medicine

## 2021-08-29 VITALS — BP 142/82 | HR 64 | Temp 98.2°F | Resp 16 | Ht 62.0 in | Wt 165.0 lb

## 2021-08-29 DIAGNOSIS — E785 Hyperlipidemia, unspecified: Secondary | ICD-10-CM

## 2021-08-29 DIAGNOSIS — E781 Pure hyperglyceridemia: Secondary | ICD-10-CM

## 2021-08-29 DIAGNOSIS — E119 Type 2 diabetes mellitus without complications: Secondary | ICD-10-CM

## 2021-08-29 DIAGNOSIS — E039 Hypothyroidism, unspecified: Secondary | ICD-10-CM

## 2021-08-29 DIAGNOSIS — I10 Essential (primary) hypertension: Secondary | ICD-10-CM

## 2021-08-29 LAB — URINALYSIS, ROUTINE W REFLEX MICROSCOPIC
Bilirubin Urine: NEGATIVE
Hgb urine dipstick: NEGATIVE
Ketones, ur: NEGATIVE
Leukocytes,Ua: NEGATIVE
Nitrite: NEGATIVE
RBC / HPF: NONE SEEN (ref 0–?)
Specific Gravity, Urine: 1.01 (ref 1.000–1.030)
Total Protein, Urine: NEGATIVE
Urine Glucose: NEGATIVE
Urobilinogen, UA: 0.2 (ref 0.0–1.0)
pH: 7 (ref 5.0–8.0)

## 2021-08-29 LAB — HEMOGLOBIN A1C: Hgb A1c MFr Bld: 6.3 % (ref 4.6–6.5)

## 2021-08-29 LAB — BASIC METABOLIC PANEL
BUN: 13 mg/dL (ref 6–23)
CO2: 31 mEq/L (ref 19–32)
Calcium: 9.3 mg/dL (ref 8.4–10.5)
Chloride: 102 mEq/L (ref 96–112)
Creatinine, Ser: 0.76 mg/dL (ref 0.40–1.20)
GFR: 79.36 mL/min (ref >60.00)
Glucose, Bld: 115 mg/dL — ABNORMAL HIGH (ref 70–99)
Potassium: 4.7 mEq/L (ref 3.5–5.1)
Sodium: 141 mEq/L (ref 135–145)

## 2021-08-29 LAB — TSH: TSH: 5.15 u[IU]/mL (ref 0.35–5.50)

## 2021-08-29 LAB — TRIGLYCERIDES: Triglycerides: 438 mg/dL — ABNORMAL HIGH (ref 0.0–149.0)

## 2021-08-29 MED ORDER — ROSUVASTATIN CALCIUM 5 MG PO TABS: 5.0000 mg | ORAL_TABLET | Freq: Every day | ORAL | 1 refills | Status: DC

## 2021-08-29 MED ORDER — FREESTYLE LANCETS MISC: 1.0000 | Freq: Two times a day (BID) | 3 refills | Status: DC

## 2021-08-29 MED ORDER — OLMESARTAN MEDOXOMIL 40 MG PO TABS: 40.0000 mg | ORAL_TABLET | Freq: Every day | ORAL | 1 refills | Status: DC

## 2021-08-29 NOTE — Patient Instructions (Signed)

## 2021-08-29 NOTE — Progress Notes (Signed)
Subjective:  Patient ID: Lori Mcdonald, female    DOB: [birth year]  Age: 70 y.o. MRN: [id number]  CC: Hyperlipidemia, Hypothyroidism, and Diabetes  This visit occurred during the SARS-CoV-2 public health emergency.  Safety protocols were in place, including screening questions prior to the visit, additional usage of staff PPE, and extensive cleaning of exam room while observing appropriate contact time as indicated for disinfecting solutions.    HPI Lori Mcdonald presents for f/up-   She is very active and denies chest pain, shortness of breath, palpitations, edema, or fatigue.  She has a history of hypothyroidism but is not currently taking a thyroid supplement.  She is not willing to get vaccinated against influenza.  Outpatient Medications Prior to Visit  Medication Sig Dispense Refill   acetaminophen (TYLENOL) 500 MG tablet Take by mouth.     glucose blood (CONTOUR NEXT TEST) test strip 1 each by Other route 2 (two) times daily. Use TO CHECK BLOOD SUGARS DAILY 100 each 3   metFORMIN (GLUCOPHAGE) 500 MG tablet Take 2 tablets in the am and 2 tablets in the pm as directed 360 tablet 1   Multiple Vitamin (THERA) TABS Take by mouth.     ibuprofen (ADVIL) 400 MG tablet Take by mouth.     olmesartan (BENICAR) 20 MG tablet TAKE 1 TABLET DAILY 90 tablet 0   rosuvastatin (CRESTOR) 5 MG tablet TAKE 1 TABLET DAILY 90 tablet 1   esomeprazole (NEXIUM) 20 MG capsule Take 1 capsule (20 mg total) by mouth daily at 12 noon. 90 capsule 1   No facility-administered medications prior to visit.    ROS Review of Systems  Constitutional:  Negative for diaphoresis and fatigue.  HENT: Negative.    Eyes: Negative.   Respiratory:  Negative for cough, chest tightness, shortness of breath and wheezing.   Cardiovascular:  Negative for chest pain, palpitations and leg swelling.  Gastrointestinal:  Negative for abdominal pain, constipation, diarrhea, nausea and vomiting.  Endocrine: Negative.   Genitourinary:  Negative.  Negative for difficulty urinating.  Musculoskeletal:  Negative for arthralgias and myalgias.  Skin: Negative.  Negative for rash.  Neurological:  Negative for dizziness, weakness, light-headedness and headaches.  Hematological:  Negative for adenopathy. Does not bruise/bleed easily.  Psychiatric/Behavioral: Negative.     Objective:  BP (!) 142/82 (BP Location: Left Arm, Patient Position: Sitting, Cuff Size: Large)   Pulse 64   Temp 98.2 F (36.8 C) (Oral)   Resp 16   Ht 5\' 2"  (1.575 m)   Wt 165 lb (74.8 kg)   SpO2 98%   BMI 30.18 kg/m   BP Readings from Last 3 Encounters:  08/29/21 (!) 142/82  01/29/21 (!) 142/78  05/09/20 122/80    Wt Readings from Last 3 Encounters:  08/29/21 165 lb (74.8 kg)  01/29/21 169 lb (76.7 kg)  05/09/20 173 lb (78.5 kg)    Physical Exam Vitals reviewed.  HENT:     Nose: Nose normal.     Mouth/Throat:     Mouth: Mucous membranes are moist.  Eyes:     General: No scleral icterus.    Conjunctiva/sclera: Conjunctivae normal.  Cardiovascular:     Rate and Rhythm: Normal rate and regular rhythm.     Heart sounds: No murmur heard. Pulmonary:     Effort: Pulmonary effort is normal.     Breath sounds: No stridor. No wheezing, rhonchi or rales.  Abdominal:     General: Abdomen is protuberant. Bowel sounds are normal. There is no  distension.     Palpations: Abdomen is soft. There is no hepatomegaly, splenomegaly or mass.     Tenderness: There is no abdominal tenderness.  Musculoskeletal:        General: Normal range of motion.     Cervical back: Neck supple.     Right lower leg: No edema.     Left lower leg: No edema.  Lymphadenopathy:     Cervical: No cervical adenopathy.  Skin:    General: Skin is warm and dry.  Neurological:     General: No focal deficit present.     Mental Status: She is alert.  Psychiatric:        Mood and Affect: Mood normal.        Behavior: Behavior normal.    Lab Results  Component Value Date    WBC 7.6 01/29/2021   HGB 12.1 01/29/2021   HCT 36.3 01/29/2021   PLT 293.0 01/29/2021   GLUCOSE 115 (H) 08/29/2021   CHOL 165 01/29/2021   TRIG (H) 08/29/2021    438.0 Triglyceride is over 400; calculations on Lipids are invalid.   HDL 34.80 (L) 01/29/2021   LDLDIRECT 87.0 01/29/2021   ALT 23 01/29/2021   AST 19 01/29/2021   NA 141 08/29/2021   K 4.7 08/29/2021   CL 102 08/29/2021   CREATININE 0.76 08/29/2021   BUN 13 08/29/2021   CO2 31 08/29/2021   TSH 5.15 08/29/2021   HGBA1C 6.3 08/29/2021   MICROALBUR 5.7 (H) 01/29/2021    DG Elbow 2 Views Left  Result Date: 04/07/2019 CLINICAL DATA:  Upper arm and posterior elbow pain since falling 5 days ago. EXAM: LEFT ELBOW - 2 VIEW COMPARISON:  None. FINDINGS: The mineralization and alignment are normal. There is no evidence of acute fracture or dislocation. There is no elbow joint effusion. Minimal spurring at the coronoid process. Possible mild soft tissue swelling posteromedially without evidence of foreign body. IMPRESSION: No acute osseous findings or significant joint effusion. Electronically Signed   By: Carey Bullocks M.D.   On: 04/07/2019 16:48   DG Humerus Left  Result Date: 04/07/2019 CLINICAL DATA:  Upper arm and posterior elbow pain since falling 5 days ago. EXAM: LEFT HUMERUS - 2+ VIEW COMPARISON:  None. FINDINGS: The bones appear adequately mineralized. No evidence of acute fracture or dislocation. Mild acromioclavicular degenerative changes are present. No focal soft tissue abnormalities are seen. IMPRESSION: No evidence of acute fracture or dislocation. Electronically Signed   By: Carey Bullocks M.D.   On: 04/07/2019 16:47    Assessment & Plan:   Lori Mcdonald was seen today for hyperlipidemia, hypothyroidism and diabetes.  Diagnoses and all orders for this visit:  Essential hypertension- Her blood pressure is adequately well controlled. -     Basic metabolic panel; Future -     TSH; Future -     Urinalysis, Routine w  reflex microscopic; Future -     olmesartan (BENICAR) 40 MG tablet; Take 1 tablet (40 mg total) by mouth daily. -     Urinalysis, Routine w reflex microscopic -     TSH -     Basic metabolic panel  Acquired hypothyroidism- Thyroid replacement therapy is not indicated. -     TSH; Future -     TSH  Type 2 diabetes mellitus without complication, without long-term current use of insulin (HCC)- Her blood sugar is adequately well controlled. -     Basic metabolic panel; Future -     Hemoglobin A1c; Future -  Lancets (FREESTYLE) lancets; 1 each by Other route in the morning and at bedtime. Use as instructed -     olmesartan (BENICAR) 40 MG tablet; Take 1 tablet (40 mg total) by mouth daily. -     Hemoglobin A1c -     Basic metabolic panel  Pure hypertriglyceridemia- I have encouraged her to continue working on her lifestyle modifications. -     Triglycerides; Future -     Triglycerides  Hyperlipidemia with target LDL less than 130- LDL goal achieved. Doing well on the statin  -     rosuvastatin (CRESTOR) 5 MG tablet; Take 1 tablet (5 mg total) by mouth daily.  I have discontinued Terilynn Sanluis's ibuprofen, esomeprazole, and olmesartan. I have also changed her rosuvastatin. Additionally, I am having her start on freestyle and olmesartan. Lastly, I am having her maintain her Thera, acetaminophen, metFORMIN, and Contour Next Test.  Meds ordered this encounter  Medications   Lancets (FREESTYLE) lancets    Sig: 1 each by Other route in the morning and at bedtime. Use as instructed    Dispense:  200 each    Refill:  3   rosuvastatin (CRESTOR) 5 MG tablet    Sig: Take 1 tablet (5 mg total) by mouth daily.    Dispense:  90 tablet    Refill:  1   olmesartan (BENICAR) 40 MG tablet    Sig: Take 1 tablet (40 mg total) by mouth daily.    Dispense:  90 tablet    Refill:  1     Follow-up: Return in about 6 months (around 02/26/2022).  Sanda Linger, MD

## 2021-09-01 ENCOUNTER — Other Ambulatory Visit: Payer: Self-pay | Admitting: Family

## 2021-09-06 ENCOUNTER — Encounter: Payer: Self-pay | Admitting: Internal Medicine

## 2021-09-07 MED ORDER — MICROLET LANCETS MISC: 2 refills | Status: AC

## 2021-09-07 NOTE — Addendum Note (Signed)
Addended by: Deatra James on: 09/07/2021 03:44 PM   Modules accepted: Orders

## 2021-09-20 ENCOUNTER — Ambulatory Visit: Payer: PRIVATE HEALTH INSURANCE | Admitting: Internal Medicine

## 2021-09-21 ENCOUNTER — Other Ambulatory Visit: Payer: Self-pay | Admitting: Internal Medicine

## 2021-09-21 DIAGNOSIS — I10 Essential (primary) hypertension: Secondary | ICD-10-CM

## 2021-09-21 DIAGNOSIS — E119 Type 2 diabetes mellitus without complications: Secondary | ICD-10-CM

## 2022-01-09 ENCOUNTER — Other Ambulatory Visit (HOSPITAL_COMMUNITY): Payer: Self-pay

## 2022-01-20 ENCOUNTER — Other Ambulatory Visit: Payer: Self-pay | Admitting: Internal Medicine

## 2022-01-20 DIAGNOSIS — I10 Essential (primary) hypertension: Secondary | ICD-10-CM

## 2022-01-20 DIAGNOSIS — E119 Type 2 diabetes mellitus without complications: Secondary | ICD-10-CM

## 2022-01-25 ENCOUNTER — Other Ambulatory Visit: Payer: Self-pay | Admitting: Internal Medicine

## 2022-01-25 DIAGNOSIS — E785 Hyperlipidemia, unspecified: Secondary | ICD-10-CM

## 2022-02-07 ENCOUNTER — Other Ambulatory Visit: Payer: Self-pay | Admitting: Family

## 2022-02-07 DIAGNOSIS — E119 Type 2 diabetes mellitus without complications: Secondary | ICD-10-CM

## 2022-03-07 ENCOUNTER — Telehealth: Payer: Self-pay | Admitting: Internal Medicine

## 2022-03-07 NOTE — Telephone Encounter (Signed)
LVM for pt to rtn my call to schedule AWV with [facility]. Call back # [phone] 

## 2022-03-08 ENCOUNTER — Ambulatory Visit (INDEPENDENT_AMBULATORY_CARE_PROVIDER_SITE_OTHER): Payer: Medicare Other

## 2022-03-08 VITALS — Wt 160.0 lb

## 2022-03-08 DIAGNOSIS — Z0000 Encounter for general adult medical examination without abnormal findings: Secondary | ICD-10-CM | POA: Diagnosis not present

## 2022-03-08 NOTE — Patient Instructions (Signed)
Lori Mcdonald , Thank you for taking time to come for your Medicare Wellness Visit. I appreciate your ongoing commitment to your health goals. Please review the following plan we discussed and let me know if I can assist you in the future.   Screening recommendations/referrals: Colonoscopy: DECLINED Recommended yearly ophthalmology/optometry visit for glaucoma screening and checkup Recommended yearly dental visit for hygiene and checkup  Vaccinations: Influenza vaccine: DECLINED Pneumococcal vaccine: Done 01/29/2021 Tdap vaccine: Done 01/29/2021 - Repeat in 10 years Shingles vaccine: DECLINED   Covid-19: Done 11/26/2019 & 12/21/2019 - declines boosters  Advanced directives: Please bring a copy of your health care power of attorney and living will to the office to be added to your chart at your convenience.   Conditions/risks identified: Aim for 30 minutes of exercise or brisk walking, 6-8 glasses of water, and 5 servings of fruits and vegetables each day.   Next appointment: Follow up in one year for your annual wellness visit.   Preventive Care 38 Years and Older, Female  Preventive care refers to lifestyle choices and visits with your health care provider that can promote health and wellness. What does preventive care include? A yearly physical exam. This is also called an annual well check. Dental exams once or twice a year. Routine eye exams. Ask your health care provider how often you should have your eyes checked. Personal lifestyle choices, including: Daily care of your teeth and gums. Regular physical activity. Eating a healthy diet. Avoiding tobacco and drug use. Limiting alcohol use. Practicing safe sex. Taking low doses of aspirin every day. Taking vitamin and mineral supplements as recommended by your health care provider. What happens during an annual well check? The services and screenings done by your health care provider during your annual well check will depend on your age,  overall health, lifestyle risk factors, and family history of disease. Counseling  Your health care provider may ask you questions about your: Alcohol use. Tobacco use. Drug use. Emotional well-being. Home and relationship well-being. Sexual activity. Eating habits. History of falls. Memory and ability to understand (cognition). Work and work Astronomer. Screening  You may have the following tests or measurements: Height, weight, and BMI. Blood pressure. Lipid and cholesterol levels. These may be checked every 5 years, or more frequently if you are over 4 years old. Skin check. Lung cancer screening. You may have this screening every year starting at age 61 if you have a 30-pack-year history of smoking and currently smoke or have quit within the past 15 years. Fecal occult blood test (FOBT) of the stool. You may have this test every year starting at age 2. Flexible sigmoidoscopy or colonoscopy. You may have a sigmoidoscopy every 5 years or a colonoscopy every 10 years starting at age 68. Prostate cancer screening. Recommendations will vary depending on your family history and other risks. Hepatitis C blood test. Hepatitis B blood test. Sexually transmitted disease (STD) testing. Diabetes screening. This is done by checking your blood sugar (glucose) after you have not eaten for a while (fasting). You may have this done every 1-3 years. Abdominal aortic aneurysm (AAA) screening. You may need this if you are a current or former smoker. Osteoporosis. You may be screened starting at age 51 if you are at high risk. Talk with your health care provider about your test results, treatment options, and if necessary, the need for more tests. Vaccines  Your health care provider may recommend certain vaccines, such as: Influenza vaccine. This is recommended every  year. Tetanus, diphtheria, and acellular pertussis (Tdap, Td) vaccine. You may need a Td booster every 10 years. Zoster vaccine.  You may need this after age 29. Pneumococcal 13-valent conjugate (PCV13) vaccine. One dose is recommended after age 39. Pneumococcal polysaccharide (PPSV23) vaccine. One dose is recommended after age 22. Talk to your health care provider about which screenings and vaccines you need and how often you need them. This information is not intended to replace advice given to you by your health care provider. Make sure you discuss any questions you have with your health care provider. Document Released: 11/03/2015 Document Revised: 06/26/2016 Document Reviewed: 08/08/2015 Elsevier Interactive Patient Education  2017 [place] Prevention in the Home Falls can cause injuries. They can happen to people of all ages. There are many things you can do to make your home safe and to help prevent falls. What can I do on the outside of my home? Regularly fix the edges of walkways and driveways and fix any cracks. Remove anything that might make you trip as you walk through a door, such as a raised step or threshold. Trim any bushes or trees on the path to your home. Use bright outdoor lighting. Clear any walking paths of anything that might make someone trip, such as rocks or tools. Regularly check to see if handrails are loose or broken. Make sure that both sides of any steps have handrails. Any raised decks and porches should have guardrails on the edges. Have any leaves, snow, or ice cleared regularly. Use sand or salt on walking paths during winter. Clean up any spills in your garage right away. This includes oil or grease spills. What can I do in the bathroom? Use night lights. Install grab bars by the toilet and in the tub and shower. Do not use towel bars as grab bars. Use non-skid mats or decals in the tub or shower. If you need to sit down in the shower, use a plastic, non-slip stool. Keep the floor dry. Clean up any water that spills on the floor as soon as it happens. Remove soap  buildup in the tub or shower regularly. Attach bath mats securely with double-sided non-slip rug tape. Do not have throw rugs and other things on the floor that can make you trip. What can I do in the bedroom? Use night lights. Make sure that you have a light by your bed that is easy to reach. Do not use any sheets or blankets that are too big for your bed. They should not hang down onto the floor. Have a firm chair that has side arms. You can use this for support while you get dressed. Do not have throw rugs and other things on the floor that can make you trip. What can I do in the kitchen? Clean up any spills right away. Avoid walking on wet floors. Keep items that you use a lot in easy-to-reach places. If you need to reach something above you, use a strong step stool that has a grab bar. Keep electrical cords out of the way. Do not use floor polish or wax that makes floors slippery. If you must use wax, use non-skid floor wax. Do not have throw rugs and other things on the floor that can make you trip. What can I do with my stairs? Do not leave any items on the stairs. Make sure that there are handrails on both sides of the stairs and use them. Fix handrails that are broken or  loose. Make sure that handrails are as long as the stairways. Check any carpeting to make sure that it is firmly attached to the stairs. Fix any carpet that is loose or worn. Avoid having throw rugs at the top or bottom of the stairs. If you do have throw rugs, attach them to the floor with carpet tape. Make sure that you have a light switch at the top of the stairs and the bottom of the stairs. If you do not have them, ask someone to add them for you. What else can I do to help prevent falls? Wear shoes that: Do not have high heels. Have rubber bottoms. Are comfortable and fit you well. Are closed at the toe. Do not wear sandals. If you use a stepladder: Make sure that it is fully opened. Do not climb a closed  stepladder. Make sure that both sides of the stepladder are locked into place. Ask someone to hold it for you, if possible. Clearly mark and make sure that you can see: Any grab bars or handrails. First and last steps. Where the edge of each step is. Use tools that help you move around (mobility aids) if they are needed. These include: Canes. Walkers. Scooters. Crutches. Turn on the lights when you go into a dark area. Replace any light bulbs as soon as they burn out. Set up your furniture so you have a clear path. Avoid moving your furniture around. If any of your floors are uneven, fix them. If there are any pets around you, be aware of where they are. Review your medicines with your doctor. Some medicines can make you feel dizzy. This can increase your chance of falling. Ask your doctor what other things that you can do to help prevent falls. This information is not intended to replace advice given to you by your health care provider. Make sure you discuss any questions you have with your health care provider. Document Released: 08/03/2009 Document Revised: 03/14/2016 Document Reviewed: 11/11/2014 Elsevier Interactive Patient Education  2017 [employer].

## 2022-03-08 NOTE — Progress Notes (Signed)
Subjective:   Latanya PresserDeborah Skoda is a 71 y.o. female who presents for Medicare Annual (Subsequent) preventive examination.  Virtual Visit via Telephone Note  I connected with  Latanya Pressereborah Gardin on 03/08/22 at  3:30 PM EDT by telephone and verified that I am speaking with the correct person using two identifiers.  Location: Patient: Home Provider: [facility] Persons participating in the virtual visit: patient/Nurse Health Advisor   I discussed the limitations, risks, security and privacy concerns of performing an evaluation and management service by telephone and the availability of in person appointments. The patient expressed understanding and agreed to proceed.  Interactive audio and video telecommunications were attempted between this nurse and patient, however failed, due to patient having technical difficulties OR patient did not have access to video capability.  We continued and completed visit with audio only.  Some vital signs may be absent or patient reported.   Zakarie Sturdivant E Jalesa Thien, LPN   Review of Systems     Cardiac Risk Factors include: advanced age (>1955men, 36>65 women);dyslipidemia;diabetes mellitus;hypertension;Other (see comment), Risk factor comments: OSA - mild - not using CPAP, fatty liver     Objective:    Today's Vitals   03/08/22 1537  Weight: 160 lb (72.6 kg)   Body mass index is 29.26 kg/m.     03/08/2022    3:52 PM  Advanced Directives  Does Patient Have a Medical Advance Directive? Yes  Type of Estate agentAdvance Directive Healthcare Power of SevernAttorney;Living will  Copy of Healthcare Power of Attorney in Chart? No - copy requested    Current Medications (verified) Outpatient Encounter Medications as of 03/08/2022  Medication Sig   acetaminophen (TYLENOL) 500 MG tablet Take by mouth.   CONTOUR NEXT TEST test strip USE ONE STRIP TO TEST DAILY   metFORMIN (GLUCOPHAGE) 500 MG tablet TAKE 2 TABLETS IN THE      MORNING AND 2 TABLETS IN   THE EVENING AS DIRECTED    Microlet Lancets MISC Use to help check blood sugars daily   Multiple Vitamin (THERA) TABS Take by mouth.   olmesartan (BENICAR) 40 MG tablet TAKE 1 TABLET DAILY   rosuvastatin (CRESTOR) 5 MG tablet TAKE 1 TABLET DAILY   No facility-administered encounter medications on file as of 03/08/2022.    Allergies (verified) Lisinopril; Other; Triamterene; Anesthetics, amide; Ciprofloxacin; Demerol [meperidine]; and Amlodipine   History: History reviewed. No pertinent past medical history. Past Surgical History:  Procedure Laterality Date   CHOLECYSTECTOMY  1995   TONSILLECTOMY  1957   History reviewed. No pertinent family history. 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 who is in Hospice   Social Determinants of Health   Financial Resource Strain: Low Risk    Difficulty of Paying Living Expenses: Not hard at all  Food Insecurity: No Food Insecurity   Worried About Programme researcher, broadcasting/film/videounning Out of Food in the Last Year: Never true   The [employer] Financialan Out of The Pro[employer]er & GambleFood  in the Last Year: Never true  Transportation Needs: No Transportation Needs   Lack of Transportation (Medical): No   Lack of Transportation (Non-Medical): No  Physical Activity: Sufficiently Active   Days of Exercise per Week: 7 days   Minutes of Exercise per Session: 30 min  Stress: No Stress Concern Present   Feeling of Stress : Only a little  Social Connections: Moderately Isolated   Frequency of Communication with Friends and Family: More than three times a week    Frequency of Social Gatherings with Friends and Family: Once a week   Attends Religious Services: Never   Database administrator or Organizations: No   Attends Engineer, structural: Never   Marital Status: Married    Tobacco Counseling Counseling given: Not Answered   Clinical Intake:  Pre-visit preparation completed: Yes  Pain : No/denies pain     BMI - recorded: 29.26 Nutritional Status: BMI 25 -29 Overweight Nutritional Risks: None Diabetes: Yes CBG done?: No Did pt. bring in CBG monitor from home?: No  How often do you need to have someone help you when you read instructions, pamphlets, or other written materials from your doctor or pharmacy?: 1 - Never  Diabetic? Nutrition Risk Assessment:  Has the patient had any N/V/D within the last 2 months?  No  Does the patient have any non-healing wounds?  No  Has the patient had any unintentional weight loss or weight gain?  No   Diabetes:  Is the patient diabetic?  Yes  If diabetic, was a CBG obtained today?  No  Did the patient bring in their glucometer from home?  No  How often do you monitor your CBG's? Once daily - usually 89-123  Financial Strains and Diabetes Management:  Are you having any financial strains with the device, your supplies or your medication? No .  Does the patient want to be seen by Chronic Care Management for management of their diabetes?  No  Would the patient like to be referred to a Nutritionist or for Diabetic Management?  No   Diabetic Exams:  Diabetic Eye Exam: Completed 08/21/2021.   Diabetic Foot Exam: Completed 01/29/2021. Pt has been advised about the importance in completing this exam. Pt is scheduled for diabetic foot exam on next visit with Dr Yetta Barre.    Interpreter Needed?: No  Information entered by :: Merritt Mccravy, LPN   Activities of Daily Living    03/08/2022    3:47 PM  In your present state of health, do you have any difficulty performing the following  activities:  Hearing? 0  Vision? 0  Difficulty concentrating or making decisions? 0  Walking or climbing stairs? 0  Dressing or bathing? 0  Doing errands, shopping? 0  Preparing Food and eating ? N  Using the Toilet? N  In the past six months, have you accidently leaked urine? Y  Comment wears pads for protection  Do you have problems with loss of bowel control? N  Managing your Medications? N  Managing your Finances? N  Housekeeping or managing your Housekeeping? N    Patient Care Team: Etta Grandchild, MD as PCP - General (Internal Medicine)  Indicate any recent Medical Services you may have received from other than [facility] providers in the past year (date may be approximate).     Assessment:   This is a routine wellness examination for Maytal.  Hearing/Vision screen Hearing Screening - Comments:: Denies hearing difficulties   Vision Screening - Comments:: Wears  rx glasses - up to date with routine eye exams with Dr Shea Evans  Dietary issues and exercise activities discussed: Current Exercise Habits: Home exercise routine, Type of exercise: walking, Time (Minutes): 30, Frequency (Times/Week): 7, Weekly Exercise (Minutes/Week): 210, Intensity: Mild, Exercise limited by: None identified   Goals Addressed             This Visit's Progress    Patient Stated       03/08/22 - Maintain independence. Keep husband comfortable (he is in Hospice)       Depression Screen    03/08/2022    3:40 PM 01/29/2021    1:16 PM 01/03/2020   11:37 AM  PHQ 2/9 Scores  PHQ - 2 Score 0 0 0  PHQ- 9 Score  0     Fall Risk    03/08/2022    3:39 PM 08/29/2021    9:17 AM 05/09/2020    3:11 PM  Fall Risk   Falls in the past year? 0 0 0  Number falls in past yr: 0  0  Injury with Fall? 0  0  Risk for fall due to : No Fall Risks  No Fall Risks  Follow up Falls prevention discussed  Falls evaluation completed    FALL RISK PREVENTION PERTAINING TO THE HOME:  Any stairs in or around the home?  Yes  If so, are there any without handrails? No  Home free of loose throw rugs in walkways, pet beds, electrical cords, etc? Yes  Adequate lighting in your home to reduce risk of falls? Yes   ASSISTIVE DEVICES UTILIZED TO PREVENT FALLS:  Life alert? No  Use of a cane, walker or w/c? No  Grab bars in the bathroom? Yes  Shower chair or bench in shower? Yes  Elevated toilet seat or a handicapped toilet? No   TIMED UP AND GO:  Was the test performed? No . Telephonic visit  Cognitive Function:        03/08/2022    3:42 PM  6CIT Screen  What Year? 0 points  What month? 0 points  What time? 0 points  Count back from 20 0 points  Months in reverse 0 points  Repeat phrase 2 points  Total Score 2 points    Immunizations Immunization History  Administered Date(s) Administered   PFIZER(Purple Top)SARS-COV-2 Vaccination 11/26/2019, 12/21/2019   PNEUMOCOCCAL CONJUGATE-20 01/29/2021   Tdap 01/29/2021    TDAP status: Up to date  Flu Vaccine status: Declined, Education has been provided regarding the importance of this vaccine but patient still declined. Advised may receive this vaccine at local pharmacy or Health Dept. Aware to provide a copy of the vaccination record if obtained from local pharmacy or Health Dept. Verbalized acceptance and understanding.  Pneumococcal vaccine status: Up to date  Covid-19 vaccine status: Completed vaccines  Qualifies for Shingles Vaccine? Yes   Zostavax completed No   Shingrix Completed?: No.    Education has been provided regarding the importance of this vaccine. Patient has been advised to call insurance company to determine out of pocket expense if they have not yet received this vaccine. Advised may also receive vaccine at local pharmacy or Health Dept. Verbalized acceptance and understanding.  Screening Tests Health Maintenance  Topic Date Due   Zoster Vaccines- Shingrix (1 of 2) Never done   COVID-19 Vaccine (3 - Pfizer risk series)  01/18/2020   TETANUS/TDAP  01/30/2031   Pneumonia Vaccine 65+ Years old  Completed   DEXA SCAN  Completed   HPV VACCINES  Aged Out   INFLUENZA VACCINE  Discontinued   MAMMOGRAM  Discontinued   COLONOSCOPY (Pts 45-53yrs Insurance coverage will need to be confirmed)  Discontinued   Hepatitis C Screening  Discontinued   Fecal DNA (Cologuard)  Discontinued    Health Maintenance  Health Maintenance Due  Topic Date Due   Zoster Vaccines- Shingrix (1 of 2) Never done   COVID-19 Vaccine (3 - Pfizer risk series) 01/18/2020    Colorectal cancer screening: No longer required.   Mammogram status: No longer required due to declined.  Bone Density status: Completed 10/04/2018. Results reflect: Bone density results: NORMAL. Repeat every 2-5 years. Declines repeat  Lung Cancer Screening: (Low Dose CT Chest recommended if Age 83-80 years, 30 pack-year currently smoking OR have quit w/in 15years.) does not qualify  Additional Screening:  Hepatitis C Screening: does qualify; Declined  Vision Screening: Recommended annual ophthalmology exams for early detection of glaucoma and other disorders of the eye. Is the patient up to date with their annual eye exam?  Yes  Who is the provider or what is the name of the office in which the patient attends annual eye exams? Dr Shea Evans If pt is not established with a provider, would they like to be referred to a provider to establish care? No .   Dental Screening: Recommended annual dental exams for proper oral hygiene  Community Resource Referral / Chronic Care Management: CRR required this visit?  No   CCM required this visit?  No      Plan:     I have personally reviewed and noted the following in the patient's chart:   Medical and social history Use of alcohol, tobacco or illicit drugs  Current medications and supplements including opioid prescriptions.  Functional ability and status Nutritional status Physical activity Advanced  directives List of other physicians Hospitalizations, surgeries, and ER visits in previous 12 months Vitals Screenings to include cognitive, depression, and falls Referrals and appointments  In addition, I have reviewed and discussed with patient certain preventive protocols, quality metrics, and best practice recommendations. A written personalized care plan for preventive services as well as general preventive health recommendations were provided to patient.     [place] Constable, LPN   12/04/863   Nurse Notes: None

## 2022-03-12 ENCOUNTER — Other Ambulatory Visit: Payer: Self-pay | Admitting: Internal Medicine

## 2022-03-13 ENCOUNTER — Encounter: Payer: Self-pay | Admitting: Internal Medicine

## 2022-04-03 ENCOUNTER — Other Ambulatory Visit: Payer: Self-pay | Admitting: Internal Medicine

## 2022-04-03 DIAGNOSIS — I10 Essential (primary) hypertension: Secondary | ICD-10-CM

## 2022-04-03 DIAGNOSIS — E119 Type 2 diabetes mellitus without complications: Secondary | ICD-10-CM

## 2022-06-23 ENCOUNTER — Other Ambulatory Visit: Payer: Self-pay | Admitting: Internal Medicine

## 2022-06-23 DIAGNOSIS — E119 Type 2 diabetes mellitus without complications: Secondary | ICD-10-CM

## 2022-06-23 DIAGNOSIS — I10 Essential (primary) hypertension: Secondary | ICD-10-CM

## 2022-08-11 ENCOUNTER — Other Ambulatory Visit: Payer: Self-pay | Admitting: Internal Medicine

## 2022-08-11 DIAGNOSIS — E785 Hyperlipidemia, unspecified: Secondary | ICD-10-CM

## 2022-09-05 ENCOUNTER — Other Ambulatory Visit (HOSPITAL_COMMUNITY): Payer: Self-pay

## 2022-09-05 MED ORDER — OXYBUTYNIN CHLORIDE ER 5 MG PO TB24
5.0000 mg | ORAL_TABLET | Freq: Every day | ORAL | 0 refills | Status: DC
  Filled 2022-09-05: qty 90, 90d supply, fill #0

## 2022-09-05 MED ORDER — OLMESARTAN MEDOXOMIL 40 MG PO TABS
40.0000 mg | ORAL_TABLET | Freq: Every day | ORAL | 3 refills | Status: AC
  Filled 2022-09-05: qty 90, 90d supply, fill #0

## 2022-09-05 MED ORDER — DICLOFENAC SODIUM 1 % EX GEL
4.0000 g | Freq: Four times a day (QID) | CUTANEOUS | 1 refills | Status: DC
  Filled 2022-09-05: qty 100, 6d supply, fill #0

## 2022-09-05 MED ORDER — FENOFIBRATE 160 MG PO TABS
160.0000 mg | ORAL_TABLET | Freq: Every day | ORAL | 3 refills | Status: DC
  Filled 2022-09-05: qty 90, 90d supply, fill #0
  Filled 2022-11-27: qty 90, 90d supply, fill #1
  Filled 2023-02-14: qty 90, 90d supply, fill #2
  Filled 2023-05-09: qty 90, 90d supply, fill #3

## 2022-09-07 ENCOUNTER — Other Ambulatory Visit (HOSPITAL_COMMUNITY): Payer: Self-pay

## 2022-09-20 ENCOUNTER — Other Ambulatory Visit (HOSPITAL_COMMUNITY): Payer: Self-pay

## 2022-09-20 MED ORDER — CONTOUR NEXT TEST VI STRP
ORAL_STRIP | 3 refills | Status: AC
  Filled 2022-09-20: qty 100, 90d supply, fill #0

## 2022-09-24 ENCOUNTER — Other Ambulatory Visit: Payer: Self-pay | Admitting: Family Medicine

## 2022-09-24 DIAGNOSIS — E2839 Other primary ovarian failure: Secondary | ICD-10-CM

## 2022-10-02 ENCOUNTER — Other Ambulatory Visit (HOSPITAL_COMMUNITY): Payer: Self-pay

## 2022-10-03 ENCOUNTER — Other Ambulatory Visit (HOSPITAL_COMMUNITY): Payer: Self-pay

## 2022-11-27 ENCOUNTER — Other Ambulatory Visit (HOSPITAL_COMMUNITY): Payer: Self-pay

## 2022-11-28 ENCOUNTER — Other Ambulatory Visit (HOSPITAL_COMMUNITY): Payer: Self-pay

## 2022-12-11 ENCOUNTER — Other Ambulatory Visit (HOSPITAL_COMMUNITY): Payer: Self-pay

## 2022-12-11 MED ORDER — ROSUVASTATIN CALCIUM 10 MG PO TABS
10.0000 mg | ORAL_TABLET | Freq: Every day | ORAL | 3 refills | Status: AC
  Filled 2022-12-11: qty 90, 90d supply, fill #0

## 2022-12-11 MED ORDER — FENOFIBRATE 160 MG PO TABS: 160.0000 mg | ORAL_TABLET | Freq: Every day | ORAL | 3 refills | Status: DC

## 2022-12-11 MED ORDER — METFORMIN HCL 500 MG PO TABS
500.0000 mg | ORAL_TABLET | Freq: Two times a day (BID) | ORAL | 0 refills | Status: DC
  Filled 2022-12-11: qty 180, 90d supply, fill #0

## 2022-12-11 MED ORDER — GEMTESA 75 MG PO TABS
75.0000 mg | ORAL_TABLET | Freq: Every day | ORAL | 3 refills | Status: DC
  Filled 2022-12-11: qty 90, 90d supply, fill #0

## 2022-12-12 ENCOUNTER — Other Ambulatory Visit (HOSPITAL_COMMUNITY): Payer: Self-pay

## 2023-03-12 ENCOUNTER — Other Ambulatory Visit (HOSPITAL_COMMUNITY): Payer: Self-pay

## 2023-03-12 MED ORDER — OXYBUTYNIN CHLORIDE ER 10 MG PO TB24
10.0000 mg | ORAL_TABLET | Freq: Every day | ORAL | 0 refills | Status: DC
  Filled 2023-03-12: qty 90, 90d supply, fill #0

## 2023-05-10 ENCOUNTER — Other Ambulatory Visit (HOSPITAL_COMMUNITY): Payer: Self-pay

## 2023-06-03 ENCOUNTER — Other Ambulatory Visit (HOSPITAL_COMMUNITY): Payer: Self-pay

## 2023-06-04 ENCOUNTER — Other Ambulatory Visit (HOSPITAL_COMMUNITY): Payer: Self-pay

## 2023-06-04 MED ORDER — OXYBUTYNIN CHLORIDE ER 10 MG PO TB24
10.0000 mg | ORAL_TABLET | Freq: Every day | ORAL | 0 refills | Status: DC
  Filled 2023-06-04: qty 90, 90d supply, fill #0

## 2023-06-19 ENCOUNTER — Other Ambulatory Visit (HOSPITAL_COMMUNITY): Payer: Self-pay

## 2023-06-19 MED ORDER — LEVOTHYROXINE SODIUM 25 MCG PO TABS
25.0000 ug | ORAL_TABLET | Freq: Every morning | ORAL | 0 refills | Status: DC
  Filled 2023-06-19: qty 90, 90d supply, fill #0

## 2023-08-18 ENCOUNTER — Other Ambulatory Visit (HOSPITAL_COMMUNITY): Payer: Self-pay

## 2023-08-18 MED ORDER — OMEPRAZOLE 40 MG PO CPDR
40.0000 mg | DELAYED_RELEASE_CAPSULE | Freq: Every day | ORAL | 1 refills | Status: DC
  Filled 2023-08-18: qty 90, 90d supply, fill #0
  Filled 2023-11-17: qty 90, 90d supply, fill #1

## 2023-08-18 MED ORDER — LEVOTHYROXINE SODIUM 25 MCG PO TABS
25.0000 ug | ORAL_TABLET | Freq: Every morning | ORAL | 3 refills | Status: AC
  Filled 2023-08-18 – 2023-09-15 (×2): qty 90, 90d supply, fill #0
  Filled 2023-12-17: qty 90, 90d supply, fill #1

## 2023-08-20 ENCOUNTER — Other Ambulatory Visit (HOSPITAL_COMMUNITY): Payer: Self-pay

## 2023-08-25 ENCOUNTER — Other Ambulatory Visit (HOSPITAL_COMMUNITY): Payer: Self-pay

## 2023-08-26 ENCOUNTER — Other Ambulatory Visit (HOSPITAL_COMMUNITY): Payer: Self-pay

## 2023-08-26 MED ORDER — FENOFIBRATE 160 MG PO TABS
160.0000 mg | ORAL_TABLET | Freq: Every day | ORAL | 3 refills | Status: AC
  Filled 2023-08-26: qty 90, 90d supply, fill #0
  Filled 2023-11-17: qty 90, 90d supply, fill #1

## 2023-08-29 ENCOUNTER — Other Ambulatory Visit (HOSPITAL_COMMUNITY): Payer: Self-pay

## 2023-09-12 ENCOUNTER — Other Ambulatory Visit (HOSPITAL_COMMUNITY): Payer: Self-pay

## 2023-09-15 ENCOUNTER — Other Ambulatory Visit (HOSPITAL_COMMUNITY): Payer: Self-pay

## 2023-09-15 MED ORDER — OXYBUTYNIN CHLORIDE ER 10 MG PO TB24
10.0000 mg | ORAL_TABLET | Freq: Every day | ORAL | 0 refills | Status: DC
  Filled 2023-09-15: qty 90, 90d supply, fill #0

## 2023-09-16 ENCOUNTER — Other Ambulatory Visit (HOSPITAL_COMMUNITY): Payer: Self-pay

## 2023-12-09 ENCOUNTER — Other Ambulatory Visit (HOSPITAL_COMMUNITY): Payer: Self-pay

## 2023-12-09 ENCOUNTER — Other Ambulatory Visit: Payer: Self-pay

## 2023-12-09 MED ORDER — FENOFIBRATE 160 MG PO TABS
160.0000 mg | ORAL_TABLET | Freq: Every day | ORAL | 3 refills | Status: DC
  Filled 2023-12-09 – 2024-02-19 (×2): qty 90, 90d supply, fill #0

## 2023-12-24 NOTE — Telephone Encounter (Signed)
non

## 2024-02-19 ENCOUNTER — Other Ambulatory Visit (HOSPITAL_COMMUNITY): Payer: Self-pay

## 2024-03-11 ENCOUNTER — Other Ambulatory Visit (HOSPITAL_COMMUNITY): Payer: Self-pay

## 2024-03-11 MED ORDER — FAMOTIDINE 40 MG PO TABS
40.0000 mg | ORAL_TABLET | Freq: Every day | ORAL | 3 refills | Status: AC
  Filled 2024-03-11 – 2024-03-25 (×2): qty 90, 90d supply, fill #0

## 2024-03-22 ENCOUNTER — Other Ambulatory Visit (HOSPITAL_COMMUNITY): Payer: Self-pay

## 2024-03-25 ENCOUNTER — Other Ambulatory Visit (HOSPITAL_COMMUNITY): Payer: Self-pay

## 2024-05-03 ENCOUNTER — Other Ambulatory Visit: Payer: Self-pay | Admitting: Internal Medicine

## 2024-05-12 ENCOUNTER — Other Ambulatory Visit: Payer: Self-pay | Admitting: Medical Genetics

## 2024-05-21 ENCOUNTER — Other Ambulatory Visit (HOSPITAL_COMMUNITY)
Admission: RE | Admit: 2024-05-21 | Discharge: 2024-05-21 | Disposition: A | Discharge status: Home / Self Care | Payer: PRIVATE HEALTH INSURANCE | Source: Ambulatory Visit | Attending: Medical Genetics | Admitting: Medical Genetics

## 2024-05-25 ENCOUNTER — Ambulatory Visit: Payer: PRIVATE HEALTH INSURANCE | Admitting: Obstetrics

## 2024-05-27 ENCOUNTER — Ambulatory Visit (INDEPENDENT_AMBULATORY_CARE_PROVIDER_SITE_OTHER): Payer: PRIVATE HEALTH INSURANCE | Admitting: Obstetrics

## 2024-05-27 ENCOUNTER — Other Ambulatory Visit: Payer: Self-pay

## 2024-05-27 ENCOUNTER — Encounter: Payer: Self-pay | Admitting: Obstetrics

## 2024-05-27 ENCOUNTER — Other Ambulatory Visit (HOSPITAL_COMMUNITY): Payer: Self-pay

## 2024-05-27 VITALS — BP 143/84 | HR 59 | Ht 61.42 in | Wt 169.8 lb

## 2024-05-27 DIAGNOSIS — Z6831 Body mass index (BMI) 31.0-31.9, adult: Secondary | ICD-10-CM

## 2024-05-27 DIAGNOSIS — E66811 Obesity, class 1: Secondary | ICD-10-CM

## 2024-05-27 DIAGNOSIS — R351 Nocturia: Secondary | ICD-10-CM | POA: Diagnosis not present

## 2024-05-27 DIAGNOSIS — N3946 Mixed incontinence: Secondary | ICD-10-CM | POA: Diagnosis not present

## 2024-05-27 DIAGNOSIS — N952 Postmenopausal atrophic vaginitis: Secondary | ICD-10-CM | POA: Insufficient documentation

## 2024-05-27 DIAGNOSIS — Z5320 Procedure and treatment not carried out because of patient's decision for unspecified reasons: Secondary | ICD-10-CM | POA: Insufficient documentation

## 2024-05-27 LAB — POCT URINALYSIS DIP (CLINITEK)
Bilirubin, UA: NEGATIVE
Blood, UA: NEGATIVE
Glucose, UA: NEGATIVE mg/dL
Ketones, POC UA: NEGATIVE mg/dL
Leukocytes, UA: NEGATIVE
Nitrite, UA: NEGATIVE
POC PROTEIN,UA: NEGATIVE
Spec Grav, UA: 1.01 (ref 1.010–1.025)
Urobilinogen, UA: 0.2 U/dL
pH, UA: 7 (ref 5.0–8.0)

## 2024-05-27 MED ORDER — ESTRADIOL 0.1 MG/GM VA CREA
0.5000 g | TOPICAL_CREAM | Freq: Every day | VAGINAL | 3 refills | Status: DC
  Filled 2024-05-27: qty 42.5, 90d supply, fill #0

## 2024-05-27 MED ORDER — TROSPIUM CHLORIDE ER 60 MG PO CP24
1.0000 | ORAL_CAPSULE | Freq: Every day | ORAL | 2 refills | Status: DC
  Filled 2024-05-27: qty 30, 30d supply, fill #0

## 2024-05-27 NOTE — Assessment & Plan Note (Addendum)
-   discontinued CPAP use 2-3 years ago - avoid fluid intake 3 hours before bedtime - elevated feet during the day or use compression socks to reduce lower extremity swelling - encouraged resuming CPAP for sleep apnea - trial of trospium

## 2024-05-27 NOTE — Assessment & Plan Note (Signed)
-   For symptomatic vaginal atrophy options include lubrication with a water-based lubricant, personal hygiene measures and barrier protection against wetness, and estrogen replacement in the form of vaginal cream, vaginal tablets, or a time-released vaginal ring.   - Rx to start low dose vaginal estrogen

## 2024-05-27 NOTE — Progress Notes (Addendum)
 New Patient Evaluation and Consultation  Referring Provider: Joshua Debby CROME, MD PCP: Waylan Almarie SAUNDERS, MD Date of Service: 05/27/2024  SUBJECTIVE Chief Complaint: New Patient (Initial Visit) Lori Mcdonald is a 73 y.o. female here here today for urinary incontinence.)  History of Present Illness: Lori Mcdonald is a 73 y.o. White or Caucasian female seen in consultation at the request of Dr Joshua for evaluation of urinary incontinence.    Avoided public bathrooms since childhood.  Urgency urinary leakage around 73yo, symptoms worsened after menopause with weight gain around 35lbs Symptoms worsened when husband was passing away, attributed to stress in the past 2 years. Increased leakage and required diaper use.  Tried oxybutynin  5mg  and 10mg  caused dry mouth that led to 4 crowns.  Did not start Gemtesa  due to cost in 2024 H/o gross hematuria at the time of UTI in 2021. Negative UA for heme 01/29/21  Review of records significant for: Left sided Bell's palsy, T2DM on metformin  with HbA1C 6.3 in 08/2021  Urinary Symptoms: Leaks urine with cough/ sneeze, exercise, lifting, going from sitting to standing, with movement to the bathroom, and continuously Leaks 3 time(s) per weeks with urgency, large leakage requiring diaper changes Leaks 3x/week with lifting her dog with smaller volume leakage Pad use: 2-4 adult diapers per day.   Patient is bothered by UI symptoms.  Day time voids 6-8.  Nocturia: 2-3 times per night every 2 hours to void for the past year. History of sleep apnea without current CPAP use, stopped around 2-3 years ago Intermittent leg swelling rarely Drinks hot milk and water for medications 1 hour before bedtime Voiding dysfunction:  empties bladder well.  Patient does not use a catheter to empty bladder.  When urinating, patient feels dribbling after finishing and the need to urinate multiple times in a row Drinks: 32oz water per day, 12oz tea, 12oz decaf tea, 8oz  milk  UTIs: 0 UTI's in the last year on D-mannose Denies history of blood in urine, kidney or bladder stones, pyelonephritis, bladder cancer, and kidney cancer Last hematuria with UTI in 2021 No results found for the last 90 days.   Pelvic Organ Prolapse Symptoms:                  Patient Denies a feeling of a bulge the vaginal area.  Bowel Symptom: Bowel movements: 3 time(s) per week, used to have 1x/day Stool consistency: hard, Type III stool Straining: no.  Splinting: no.  Incomplete evacuation: no.  Patient Denies accidental bowel leakage / fecal incontinence Bowel regimen: magnesium  supplementation daily since iron supplementation Last colonoscopy: Cologuard due in 2022, no results available for review HM Colonoscopy          Completed or No Longer Recommended     Colonoscopy  Discontinued   No completion history exists for this topic.                 Sexual Function Sexually active: no.  Sexual orientation: Straight Pain with sex: No  Pelvic Pain Denies pelvic pain   Past Medical History:  Past Medical History:  Diagnosis Date   Diabetes mellitus (HCC)    type 2   GERD (gastroesophageal reflux disease)    Hx of iron deficiency    Hyperlipidemia    Hypertension    Hypothyroidism    Multiple acquired skin tags    Obesity    Urinary incontinence    Vertigo      Past Surgical History:   Past  Surgical History:  Procedure Laterality Date   CHOLECYSTECTOMY  1995   TONSILLECTOMY  1957     Past OB/GYN History: OB History  Gravida Para Term Preterm AB Living  2 2 2   2   SAB IAB Ectopic Multiple Live Births      2    # Outcome Date GA Lbr Len/2nd Weight Sex Type Anes PTL Lv  2 Term  [redacted]w[redacted]d   F Vag-Spont   LIV  1 Term  [redacted]w[redacted]d   F Vag-Spont   LIV    Vaginal deliveries: largest infant 7lb6oz,  Forceps/ Vacuum deliveries: 0, Cesarean section: 0 Menopausal: Yes, at age 66, Denies vaginal bleeding since menopause Contraception: none. Last pap  smear was 1995.  Any history of abnormal pap smears: no. No results found for: DIAGPAP, HPVHIGH, ADEQPAP  Medications: Patient has a current medication list which includes the following prescription(s): biotin, contour next test, d-mannose, [START ON 05/31/2024] estradiol , famotidine , fenofibrate , ferrous sulfate, contour next test, levothyroxine , magnesium  oxide, metformin , microlet lancets, thera, olmesartan , rosuvastatin , trospium  chloride, vitamin d (ergocalciferol), and vitamin e.   Allergies: Patient is allergic to lisinopril; other; triamterene; anesthetics, amide; ciprofloxacin; demerol [meperidine]; and amlodipine .   Social History:  Social History   Tobacco Use   Smoking status: Never   Smokeless tobacco: Never  Vaping Use   Vaping status: Never Used  Substance Use Topics   Alcohol use: Never   Drug use: Never    Relationship status: widowed Patient lives alone.   Patient is not employed. Regular exercise: Yes: walking and simple exercises History of abuse: No  Family History:   Family History  Problem Relation Age of Onset   Pancreatic cancer Brother    Liver cancer Brother    Bladder Cancer Neg Hx    Uterine cancer Neg Hx    Renal cancer Neg Hx      Review of Systems: Review of Systems  Constitutional:  Negative for fever, malaise/fatigue and weight loss.  Respiratory:  Negative for cough, shortness of breath and wheezing.   Cardiovascular:  Negative for chest pain, palpitations and leg swelling.  Gastrointestinal:  Negative for abdominal pain, blood in stool and constipation.  Genitourinary:  Positive for frequency. Negative for dysuria, hematuria and urgency.       Leakage  Skin:  Negative for rash.  Neurological:  Negative for dizziness, weakness and headaches.  Endo/Heme/Allergies:  Bruises/bleeds easily.  Psychiatric/Behavioral:  Negative for depression. The patient is not nervous/anxious.      OBJECTIVE Physical Exam: Vitals:   05/27/24  1056  BP: (!) 143/84  Pulse: (!) 59  Weight: 169 lb 12.8 oz (77 kg)  Height: 5' 1.42 (1.56 m)    Physical Exam Constitutional:      General: She is not in acute distress.    Appearance: Normal appearance.  Genitourinary:     Bladder and urethral meatus normal.     No lesions in the vagina.     Right Labia: No rash, tenderness, lesions, skin changes or Bartholin's cyst.    Left Labia: No tenderness, lesions, skin changes, Bartholin's cyst or rash.    No vaginal discharge, erythema, tenderness, bleeding, ulceration or granulation tissue.     Moderate vaginal atrophy present.     Right Adnexa: not tender, not full and no mass present.    Left Adnexa: not tender, not full and no mass present.    No cervical motion tenderness, discharge, friability, lesion, polyp or nabothian cyst.     Uterus  is not enlarged, fixed, tender or irregular.     No uterine mass detected.    Urethral meatus caruncle not present.    Urethral stress urinary incontinence with cough stress test present.     No urethral prolapse, tenderness, mass, hypermobility or discharge present.     Bladder is not tender, urgency on palpation not present and masses not present.      Pelvic Floor: Levator muscle strength is 3/5.    Levator ani not tender, obturator internus not tender, no asymmetrical contractions present and no pelvic spasms present.    Symmetrical pelvic sensation, anal wink present and BC reflex present. Cardiovascular:     Rate and Rhythm: Normal rate.  Pulmonary:     Effort: Pulmonary effort is normal. No respiratory distress.  Abdominal:     General: There is no distension.     Palpations: Abdomen is soft. There is no mass.     Tenderness: There is no abdominal tenderness.     Hernia: No hernia is present.  Neurological:     Mental Status: She is alert.  Vitals reviewed. Exam conducted with a chaperone present.      POP-Q:   POP-Q  -2                                            Aa   -2                                            Ba  -5                                              C   2                                            Gh  2                                            Pb  7                                            tvl   -2                                            Ap  -2                                            Bp  -7  D    Post-Void Residual (PVR) by Bladder Scan: In order to evaluate bladder emptying, we discussed obtaining a postvoid residual and patient agreed to this procedure.  Procedure: The ultrasound unit was placed on the patient's abdomen in the suprapubic region after the patient had voided.    Post Void Residual - 05/27/24 1132       Post Void Residual   Post Void Residual 26 mL           Laboratory Results: Lab Results  Component Value Date   COLORU yellow 05/27/2024   CLARITYU clear 05/27/2024   GLUCOSEUR negative 05/27/2024   BILIRUBINUR negative 05/27/2024   SPECGRAV 1.010 05/27/2024   RBCUR negative 05/27/2024   PHUR 7.0 05/27/2024   PROTEINUR 1+ (A) 07/10/2020   UROBILINOGEN 0.2 05/27/2024   LEUKOCYTESUR Negative 05/27/2024    Lab Results  Component Value Date   CREATININE 0.76 08/29/2021   CREATININE 0.75 01/29/2021   CREATININE 0.64 01/03/2020    Lab Results  Component Value Date   HGBA1C 6.3 08/29/2021    Lab Results  Component Value Date   HGB 12.1 01/29/2021     ASSESSMENT AND PLAN Ms. Drenning is a 73 y.o. with:  1. Mixed stress and urge urinary incontinence   2. Nocturia   3. Vaginal atrophy   4. Cervical cancer screening declined   5. 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  While we do not know the exact etiology of OAB, several treatment options exist. We discussed management including behavioral therapy (decreasing bladder irritants, urge suppression strategies, timed voids, bladder retraining), physical therapy, medication; for refractory cases posterior tibial nerve stimulation, sacral neuromodulation, and intravesical botulinum toxin injection.  For anticholinergic medications, we discussed the potential side effects of anticholinergics including dry eyes, dry mouth, constipation, cognitive impairment and urinary retention. For Beta-3 agonist medication, we discussed the potential side effect of elevated blood pressure which is more likely to occur in individuals with uncontrolled hypertension. - Rx trospium . Discussed costplus if cost prohibitive. Discontinue if dry mouth For refractory OAB we reviewed the procedure for intravesical Botox injection with cystoscopy in the office and reviewed the risks, benefits and alternatives of treatment including but not limited to infection, need for self-catheterization and need for repeat therapy.  We discussed that there is a 5-15% chance of needing to catheterize with Botox and that this usually resolves in a few months; however can persist for longer periods of time.  Typically Botox injections would need to be repeated every 3-12 months since this is not a permanent therapy.   We discussed the role of sacral neuromodulation and how it works. It requires a test phase, and documentation of bladder function via diary. After a successful test period, a permanent wire and generator are placed in the OR. The battery lasts 5 years on average and would need to be replaced surgically.  The goal of this therapy is at least a 50% improvement in symptoms. It is NOT realistic to expect a 100% cure.   We reviewed the fact that about 30% of patients fail the test phase and  are not candidates for permanent generator placement.  We discussed the risk of infection and that the patient would not be able to get an MRI once the device is placed. There are two companies that provide this therapy: [employer] and [employer]. [employer]' product is new and is similar to [employer]'s, but has advantages of a smaller and rechargeable battery and being able to have an MRI with the implant. For all procedures, we discussed risks of bleeding, infection, damage to surrounding organs including bowel, bladder, blood vessels, ureters and nerves, need for further surgery, risk of postoperative urinary incontinence or retention with need to catheterize, recurrent prolapse, numbness and weakness at any body site, buttock pain, and the rarer risks of blood clot, heart attack, pneumonia, death.    We also discussed the role of percutaneous tibial nerve stimulation and how it works.  She understands it requires 12 weekly visits for temporary neuromodulation of the sacral nerve roots via the tibial nerve and that she may then require continued tapered treatment.  She will return for the procedure. All questions were answered. - provided handout for PTNS, patient considering due to h/o acupuncture  - positive CST - For treatment of stress urinary incontinence,  non-surgical options include expectant management, weight loss, physical therapy, as well as a pessary.  Surgical options include a midurethral sling, Burch urethropexy, and transurethral injection of a bulking agent. - encouraged Kegel exercises and weight reduction - start low dose vaginal estrogen  Orders: -     Trospium  Chloride ER; Take 1 capsule (60 mg total) by mouth daily.  Dispense: 30 capsule; Refill: 2 -     POCT URINALYSIS DIP (CLINITEK)  Nocturia Assessment & Plan: - discontinued CPAP use 2-3 years ago - avoid fluid intake 3 hours before bedtime - elevated feet  during the day or use compression socks to reduce lower extremity swelling - encouraged resuming CPAP for sleep apnea - trial of trospium   Orders: -     Trospium  Chloride ER; Take 1 capsule (60 mg total) by mouth daily.  Dispense: 30 capsule; Refill: 2 -     POCT URINALYSIS DIP (CLINITEK)  Vaginal atrophy Assessment & Plan: - For symptomatic vaginal atrophy options include lubrication with a water-based lubricant, personal hygiene measures and barrier protection against wetness, and estrogen replacement in the form of vaginal cream, vaginal tablets, or a time-released vaginal ring.   - Rx to start low dose vaginal estrogen  Orders: -     Estradiol ; Place 0.5g nightly for two weeks then twice a week after  Dispense: 30 g; Refill: 3  Cervical cancer screening declined Assessment & Plan: - denies history of abnormal pap smears - last pap smear in her 40s in 1995. 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   Lianne ONEIDA Gillis, MD

## 2024-05-27 NOTE — Assessment & Plan Note (Signed)
-   POCT UA negative, PVR 26mL - prior oxybutynin  use with dry mouth and dental caries, Gemtesa  cost prohibitive - We discussed the symptoms of overactive bladder (OAB), which include urinary urgency, urinary frequency, nocturia, with or without urge incontinence.  While we do not know the exact etiology of OAB, several treatment options exist. We discussed management including behavioral therapy (decreasing bladder irritants, urge suppression strategies, timed voids, bladder retraining), physical therapy, medication; for refractory cases posterior tibial nerve stimulation, sacral neuromodulation, and intravesical botulinum toxin injection.  For anticholinergic medications, we discussed the potential side effects of anticholinergics including dry eyes, dry mouth, constipation, cognitive impairment and urinary retention. For Beta-3 agonist medication, we discussed the potential side effect of elevated blood pressure which is more likely to occur in individuals with uncontrolled hypertension. - Rx trospium . Discussed costplus if cost prohibitive. Discontinue if dry mouth For refractory OAB we reviewed the procedure for intravesical Botox injection with cystoscopy in the office and reviewed the risks, benefits and alternatives of treatment including but not limited to infection, need for self-catheterization and need for repeat therapy.  We discussed that there is a 5-15% chance of needing to catheterize with Botox and that this usually resolves in a few months; however can persist for longer periods of time.  Typically Botox injections would need to be repeated every 3-12 months since this is not a permanent therapy.   We discussed the role of sacral neuromodulation and how it works. It requires a test phase, and documentation of bladder function via diary. After a successful test period, a permanent wire and generator are placed in the OR. The battery lasts 5 years on average and would need to be replaced  surgically.  The goal of this therapy is at least a 50% improvement in symptoms. It is NOT realistic to expect a 100% cure.  We reviewed the fact that about 30% of patients fail the test phase and are not candidates for permanent generator placement.  We discussed the risk of infection and that the patient would not be able to get an MRI once the device is placed. There are two companies that provide this therapy: [employer] and [employer]. [employer]' product is new and is similar to [employer]'s, but has advantages of a smaller and rechargeable battery and being able to have an MRI with the implant. For all procedures, we discussed risks of bleeding, infection, damage to surrounding organs including bowel, bladder, blood vessels, ureters and nerves, need for further surgery, risk of postoperative urinary incontinence or retention with need to catheterize, recurrent prolapse, numbness and weakness at any body site, buttock pain, and the rarer risks of blood clot, heart attack, pneumonia, death.    We also discussed the role of percutaneous tibial nerve stimulation and how it works.  She understands it requires 12 weekly visits for temporary neuromodulation of the sacral nerve roots via the tibial nerve and that she may then require continued tapered treatment.  She will return for the procedure. All questions were answered. - provided handout for PTNS, patient considering due to h/o acupuncture  - positive CST - For treatment of stress urinary incontinence,  non-surgical options include expectant management, weight loss, physical therapy, as well as a pessary.  Surgical options include a midurethral sling, Burch urethropexy, and transurethral injection of a bulking agent. - encouraged Kegel exercises and weight reduction - start low dose vaginal estrogen

## 2024-05-27 NOTE — Patient Instructions (Addendum)
 We discussed the symptoms of overactive bladder (OAB), which include urinary urgency, urinary frequency, night-time urination, with or without urge incontinence.  We discussed management including behavioral therapy (decreasing bladder irritants by following a bladder diet, urge suppression strategies, timed voids, bladder retraining), physical therapy, medication; and for refractory cases posterior tibial nerve stimulation, sacral neuromodulation, and intravesical botulinum toxin injection.   For anticholinergic medications, we discussed the potential side effects of anticholinergics including dry eyes, dry mouth, constipation, rare risks of cognitive impairment and urinary retention. You were given prescription for Trospium .  It can take a month to start working so give it time, but if you have bothersome side effects call sooner and we can try a different medication.  Call us  if you have trouble filling the prescription or if it's not covered by your insurance.  For treatment of stress urinary incontinence, which is leakage with physical activity/movement/strainging/coughing, we discussed expectant management versus nonsurgical options versus surgery. Nonsurgical options include weight loss, physical therapy, as well as a pessary.  Surgical options include a midurethral sling, which is a synthetic mesh sling that acts like a hammock under the urethra to prevent leakage of urine, a Burch urethropexy, and transurethral injection of a bulking agent.   For night time frequency: - avoid fluid intake 3 hours before bedtime - elevated your feet during the day or use compression socks to reduce lower extremity swelling - if you snore, consider sleep study to rule out sleep apnea  Constipation: Our goal is to achieve formed bowel movements daily or every-other-day.  You may need to try different combinations of the following options to find what works best for you - everybody's body works differently so feel  free to adjust the dosages as needed.  Some options to help maintain bowel health include:  Dietary changes (more leafy greens, vegetables and fruits; less processed foods) Fiber supplementation (Benefiber, FiberCon, Metamucil or Psyllium). Start slow and increase gradually to full dose. Over-the-counter agents such as: stool softeners (Docusate or Colace) and/or laxatives (Miralax, milk of magnesia)  Power Pudding is a natural mixture that may help your constipation.  To make blend 1 cup applesauce, 1 cup wheat bran, and 3/4 cup prune juice, refrigerate and then take 1 tablespoon daily with a large glass of water as needed.   Women should try to eat at least 21 to 25 grams of fiber a day, while men should aim for 30 to 38 grams a day. You can add fiber to your diet with food or a fiber supplement such as psyllium (metamucil), benefiber, or fibercon.   Here's a look at how much dietary fiber is found in some common foods. When buying packaged foods, check the Nutrition Facts label for fiber content. It can vary among brands.  Fruits Serving size Total fiber (grams)*  Raspberries 1 cup 8.0  Pear 1 medium 5.5  Apple, with skin 1 medium 4.5  Banana 1 medium 3.0  Orange 1 medium 3.0  Strawberries 1 cup 3.0   Vegetables Serving size Total fiber (grams)*  Green peas, boiled 1 cup 9.0  Broccoli, boiled 1 cup chopped 5.0  Turnip greens, boiled 1 cup 5.0  Brussels sprouts, boiled 1 cup 4.0  Potato, with skin, baked 1 medium 4.0  Sweet corn, boiled 1 cup 3.5  Cauliflower, raw 1 cup chopped 2.0  Carrot, raw 1 medium 1.5   Grains Serving size Total fiber (grams)*  Spaghetti, whole-wheat, cooked 1 cup 6.0  Barley, pearled, cooked 1 cup 6.0  Bran flakes 3/4 cup 5.5  Quinoa, cooked 1 cup 5.0  Oat bran muffin 1 medium 5.0  Oatmeal, instant, cooked 1 cup 5.0  Popcorn, air-popped 3 cups 3.5  Brown rice, cooked 1 cup 3.5  Bread, whole-wheat 1 slice 2.0  Bread, rye 1 slice 2.0   Legumes, nuts  and seeds Serving size Total fiber (grams)*  Split peas, boiled 1 cup 16.0  Lentils, boiled 1 cup 15.5  Black beans, boiled 1 cup 15.0  Baked beans, canned 1 cup 10.0  Chia seeds 1 ounce 10.0  Almonds 1 ounce (23 nuts) 3.5  Pistachios 1 ounce (49 nuts) 3.0  Sunflower kernels 1 ounce 3.0  *Rounded to nearest 0.5 gram. Source: [employer] for [employer], Legacy Release    For vaginal atrophy (thinning of the vaginal tissue that can cause dryness and burning) and UTI prevention we discussed estrogen replacement in the form of vaginal cream.   Start vaginal estrogen therapy nightly for two weeks then 2 times weekly at night. This can be placed with your finger or an applicator inside the vagina and around the urethra.  Please let us  know if the prescription is too expensive and we can look for alternative options.   Is vaginal estrogen therapy safe for me? Vaginal estrogen preparations act on the vaginal skin, and only a very tiny amount is absorbed into the bloodstream (0.01%).  They work in a similar way to hand or face cream.  There is minimal absorption and they are therefore perfectly safe. If you have had breast cancer and have persistent troublesome symptoms which aren't settling with vaginal moisturisers and lubricants, local estrogen treatment may be a possibility, but consultation with your oncologist should take place first.

## 2024-05-27 NOTE — Assessment & Plan Note (Signed)
-   discussed association with pelvic floor disorders and glycemic control - encouraged increase activity and diet modification

## 2024-05-27 NOTE — Assessment & Plan Note (Signed)
-   denies history of abnormal pap smears - last pap smear in her 40s in 1995. Declines repeat - discussed cervical cancer screening recommendation and encouraged to consider pap smear and referral to GYN to establish care since patient has not undergone hysterectomy

## 2024-05-28 ENCOUNTER — Other Ambulatory Visit: Payer: Self-pay | Admitting: Obstetrics

## 2024-05-28 ENCOUNTER — Telehealth: Payer: Self-pay

## 2024-05-28 ENCOUNTER — Other Ambulatory Visit (HOSPITAL_COMMUNITY): Payer: Self-pay

## 2024-05-28 DIAGNOSIS — N952 Postmenopausal atrophic vaginitis: Secondary | ICD-10-CM

## 2024-05-28 DIAGNOSIS — R351 Nocturia: Secondary | ICD-10-CM

## 2024-05-28 DIAGNOSIS — N3946 Mixed incontinence: Secondary | ICD-10-CM

## 2024-05-28 MED ORDER — TROSPIUM CHLORIDE ER 60 MG PO CP24: 1.0000 | ORAL_CAPSULE | Freq: Every day | ORAL | 2 refills | Status: DC

## 2024-05-28 MED ORDER — ESTRADIOL 0.1 MG/GM VA CREA: TOPICAL_CREAM | VAGINAL | 3 refills | Status: AC

## 2024-05-28 NOTE — Telephone Encounter (Signed)
 Incoming call from patient. She is concerned with the cost of her new medications, both co-pays are over $100 each. I [facility] Estrace . What alternative can we use instead of Trospium  60mg 

## 2024-05-28 NOTE — Progress Notes (Signed)
 Rx changed to [facility].  Please visit the website below to sign up for an account. We will need an email address to send along with your prescription to verify your prescription once you have signed up.  [email or web address]  Trospium  Chloride ER Capsule Extended Release  60mg   30 count $31.11

## 2024-05-31 ENCOUNTER — Other Ambulatory Visit (HOSPITAL_COMMUNITY): Payer: Self-pay

## 2024-06-01 LAB — GENECONNECT MOLECULAR SCREEN: Genetic Analysis Overall Interpretation: NEGATIVE

## 2024-06-02 NOTE — Telephone Encounter (Signed)
 My chart message sent

## 2024-06-03 ENCOUNTER — Other Ambulatory Visit (HOSPITAL_COMMUNITY): Payer: Self-pay

## 2024-06-03 ENCOUNTER — Other Ambulatory Visit: Payer: Self-pay

## 2024-06-03 MED ORDER — MICROLET LANCETS MISC
16 refills | Status: AC
  Filled 2024-06-03: qty 100, 90d supply, fill #0

## 2024-06-16 ENCOUNTER — Other Ambulatory Visit (HOSPITAL_COMMUNITY): Payer: Self-pay

## 2024-06-16 MED ORDER — MOUNJARO 2.5 MG/0.5ML ~~LOC~~ SOAJ
2.5000 mg | SUBCUTANEOUS | 1 refills | Status: AC
  Filled 2024-06-16: qty 2, 28d supply, fill #0

## 2024-06-19 ENCOUNTER — Other Ambulatory Visit (HOSPITAL_COMMUNITY): Payer: Self-pay

## 2024-06-27 ENCOUNTER — Other Ambulatory Visit (HOSPITAL_COMMUNITY): Payer: Self-pay

## 2024-06-28 ENCOUNTER — Encounter: Payer: Self-pay | Admitting: Obstetrics and Gynecology

## 2024-06-28 ENCOUNTER — Ambulatory Visit (INDEPENDENT_AMBULATORY_CARE_PROVIDER_SITE_OTHER): Payer: PRIVATE HEALTH INSURANCE | Admitting: Obstetrics and Gynecology

## 2024-06-28 VITALS — BP 113/73 | HR 65

## 2024-06-28 DIAGNOSIS — R351 Nocturia: Secondary | ICD-10-CM | POA: Diagnosis not present

## 2024-06-28 DIAGNOSIS — N3946 Mixed incontinence: Secondary | ICD-10-CM

## 2024-06-28 NOTE — Progress Notes (Signed)
 [facility]  PTNS VISIT  CC:  Overactive bladder  73 y.o. with refractory overactive bladder who presents for percutaneous tibial nerve stimulation. The patient presents for PTNS session # 1.  Patient does not have bladder diary this visit. Will have her complete one prior to next appointment.   Patient has been having some issues with her Trospium  as it is causing significant dry mouth and then she changed when she was taking it and had incontinence episodes at nighttime.   Procedure: The patient was placed in the sitting position and the left lower extremity was prepped in the usual fashion. The PTNS needle was then inserted at a 60 degree angle, 5 cm cephalad and 2 cm posterior to the medial malleolus. The PTNS unit was then programmed and an optimal response was noted at 12 milliamps. The PTNS stimulation was then performed at this setting for 30 minutes without incident and the patient tolerated the procedure well. The needle was removed and hemostasis was noted.   The pt will return in 1 week for PTNS session # 2. All questions were answered.

## 2024-07-05 ENCOUNTER — Ambulatory Visit (INDEPENDENT_AMBULATORY_CARE_PROVIDER_SITE_OTHER): Payer: PRIVATE HEALTH INSURANCE | Admitting: Obstetrics and Gynecology

## 2024-07-05 ENCOUNTER — Encounter: Payer: Self-pay | Admitting: Obstetrics and Gynecology

## 2024-07-05 VITALS — BP 122/80 | HR 66

## 2024-07-05 DIAGNOSIS — N3281 Overactive bladder: Secondary | ICD-10-CM | POA: Diagnosis not present

## 2024-07-05 DIAGNOSIS — N3946 Mixed incontinence: Secondary | ICD-10-CM

## 2024-07-05 DIAGNOSIS — R351 Nocturia: Secondary | ICD-10-CM

## 2024-07-05 NOTE — Progress Notes (Signed)
 [facility]  PTNS VISIT  CC:  Overactive bladder  73 y.o. with refractory overactive bladder who presents for percutaneous tibial nerve stimulation. The patient presents for PTNS session # 2.  She reports some improvement in nighttime urination (Down from 2-3 times a night to once per day)  States she did still have leakage on her 3.5 hour drive to [place]  for a funeral this past week.   Procedure: The patient was placed in the sitting position and the left lower extremity was prepped in the usual fashion. The PTNS needle was then inserted at a 60 degree angle, 5 cm cephalad and 2 cm posterior to the medial malleolus. The PTNS unit was then programmed and an optimal response was noted at 10 milliamps. The PTNS stimulation was then performed at this setting for 30 minutes without incident and the patient tolerated the procedure well. The needle was removed and hemostasis was noted.   The pt will return in 1 week for PTNS session # 3. All questions were answered.

## 2024-07-12 ENCOUNTER — Encounter: Payer: Self-pay | Admitting: Obstetrics and Gynecology

## 2024-07-12 ENCOUNTER — Ambulatory Visit (INDEPENDENT_AMBULATORY_CARE_PROVIDER_SITE_OTHER): Payer: PRIVATE HEALTH INSURANCE | Admitting: Obstetrics and Gynecology

## 2024-07-12 VITALS — BP 133/62 | HR 71

## 2024-07-12 DIAGNOSIS — N3946 Mixed incontinence: Secondary | ICD-10-CM

## 2024-07-12 DIAGNOSIS — R351 Nocturia: Secondary | ICD-10-CM

## 2024-07-12 DIAGNOSIS — N3281 Overactive bladder: Secondary | ICD-10-CM | POA: Diagnosis not present

## 2024-07-12 MED ORDER — MIRABEGRON ER 25 MG PO TB24: 25.0000 mg | ORAL_TABLET | Freq: Every day | ORAL | 0 refills | Status: DC

## 2024-07-12 NOTE — Progress Notes (Signed)
 [facility]  PTNS VISIT  CC:  Overactive bladder  73 y.o. with refractory overactive bladder who presents for percutaneous tibial nerve stimulation. The patient presents for PTNS session # 3.   Procedure: The patient was placed in the sitting position and the right lower extremity was prepped in the usual fashion. The PTNS needle was then inserted at a 60 degree angle, 5 cm cephalad and 2 cm posterior to the medial malleolus. The PTNS unit was then programmed and an optimal response was noted at 8 milliamps. The PTNS stimulation was then performed at this setting for 30 minutes without incident and the patient tolerated the procedure well. The needle was removed and hemostasis was noted.   The pt will return in 1 week for PTNS session # 4. All questions were answered.

## 2024-07-19 ENCOUNTER — Ambulatory Visit (INDEPENDENT_AMBULATORY_CARE_PROVIDER_SITE_OTHER): Payer: PRIVATE HEALTH INSURANCE | Admitting: Obstetrics and Gynecology

## 2024-07-19 VITALS — BP 108/73 | HR 68

## 2024-07-19 DIAGNOSIS — N3946 Mixed incontinence: Secondary | ICD-10-CM

## 2024-07-19 DIAGNOSIS — R351 Nocturia: Secondary | ICD-10-CM

## 2024-07-19 DIAGNOSIS — N3281 Overactive bladder: Secondary | ICD-10-CM

## 2024-07-19 NOTE — Progress Notes (Signed)
 [facility]  PTNS VISIT  CC:  Overactive bladder  73 y.o. with refractory overactive bladder who presents for percutaneous tibial nerve stimulation. The patient presents for PTNS session # 4.  She reports that this combined with medication has given her a >50% improvement in her frequency and urgency, but she is still wearing a diaper to church. Goal with patient is to get her down to a pad for church or dry.   Procedure: The patient was placed in the sitting position and the right lower extremity was prepped in the usual fashion. The PTNS needle was then inserted at a 60 degree angle, 5 cm cephalad and 2 cm posterior to the medial malleolus. The PTNS unit was then programmed and an optimal response was noted at 2 milliamps. The PTNS stimulation was then performed at this setting for 30 minutes without incident and the patient tolerated the procedure well. The needle was removed and hemostasis was noted.   The pt will return in 1 week for PTNS session # 5. All questions were answered.

## 2024-08-02 ENCOUNTER — Encounter: Payer: Self-pay | Admitting: Obstetrics and Gynecology

## 2024-08-02 ENCOUNTER — Ambulatory Visit: Payer: PRIVATE HEALTH INSURANCE | Admitting: Obstetrics and Gynecology

## 2024-08-02 VITALS — BP 122/82 | HR 72

## 2024-08-02 DIAGNOSIS — N3946 Mixed incontinence: Secondary | ICD-10-CM

## 2024-08-02 DIAGNOSIS — R351 Nocturia: Secondary | ICD-10-CM

## 2024-08-02 NOTE — Progress Notes (Signed)
 [facility] Return Visit  SUBJECTIVE  History of Present Illness: Lori Mcdonald is a 73 y.o. female seen in follow-up for OAB/UUI. Plan at last visit was continue PTNS. Patient went on a trip and reports she had to wear adult diapers the whole time. She states she also has not noticed a difference since starting the PTNS and she is concerned about continuing on the anticholinergic medication due to the side effects of dry mouth, but that she cannot afford the Beta 3 agonists.     Past Medical History: Patient  has a past medical history of Diabetes mellitus (HCC), GERD (gastroesophageal reflux disease), iron deficiency, Hyperlipidemia, Hypertension, Hypothyroidism, Multiple acquired skin tags, Obesity, Urinary incontinence, and Vertigo.   Past Surgical History: She  has a past surgical history that includes Tonsillectomy (8042) and Cholecystectomy (1995).   Medications: She has a current medication list which includes the following prescription(s): biotin, contour next test, d-mannose, estradiol , famotidine , fenofibrate , ferrous sulfate, contour next test, levothyroxine , magnesium  oxide, metformin , microlet lancets, microlet lancets, thera, multiple vitamins-minerals, olmesartan , rosuvastatin , mounjaro , trospium  chloride, vitamin d (ergocalciferol), and vitamin e.   Allergies: Patient is allergic to lisinopril; other; triamterene; anesthetics, amide; ciprofloxacin; demerol [meperidine]; and amlodipine .   Social History: Patient  reports that she has never smoked. She has never used smokeless tobacco. She reports that she does not drink alcohol and does not use drugs.     OBJECTIVE     Physical Exam: Vitals:   08/02/24 1140  BP: 122/82  Pulse: 72   Gen: No apparent distress, A&O x 3.  Detailed Urogynecologic Evaluation:  Deferred.    ASSESSMENT AND PLAN    Lori Mcdonald is a 73 y.o. with:  1. Mixed stress and urge urinary incontinence   2. Nocturia    Patient to  continue Trospium . We discussed other tertiary options of SNM vs. Bladder botox. She is interested in possibly doing bladder botox. She watched the in-office video and was given handouts from both IUGA and Botox brand.  Patient to consider botox, if she would like to have botox she will call and we will schedule her for a self cath teaching. Would have preferred to try a beta 3 agonist prior to other tertiary options but the cost of medication is not an option for patient.   Patient to call with her decision  Zoey Bidwell G Zameria Vogl, NP

## 2024-08-09 ENCOUNTER — Ambulatory Visit: Payer: PRIVATE HEALTH INSURANCE | Admitting: Obstetrics and Gynecology

## 2024-08-26 MED ORDER — TROSPIUM CHLORIDE ER 60 MG PO CP24: 1.0000 | ORAL_CAPSULE | Freq: Every day | ORAL | 3 refills | Status: AC

## 2024-08-26 NOTE — Addendum Note (Signed)
 Addended by: Adeena Bernabe N on: 08/26/2024 05:08 PM   Modules accepted: Orders
